# Patient Record
Sex: Female | Born: 1965 | Race: Black or African American | Hispanic: No | Smoking: Current every day smoker
Health system: Southern US, Community
[De-identification: ages and names within clinical notes are randomized; demographics above are authoritative.]

## PROBLEM LIST (undated history)

## (undated) DIAGNOSIS — K2970 Gastritis, unspecified, without bleeding: Secondary | ICD-10-CM

## (undated) DIAGNOSIS — K219 Gastro-esophageal reflux disease without esophagitis: Secondary | ICD-10-CM

## (undated) HISTORY — PX: ECTOPIC PREGNANCY SURGERY: SHX613

## (undated) HISTORY — PX: OTHER SURGICAL HISTORY: SHX169

---

## 2011-06-11 ENCOUNTER — Encounter (HOSPITAL_COMMUNITY): Payer: Self-pay

## 2011-06-11 ENCOUNTER — Emergency Department (HOSPITAL_COMMUNITY)
Admission: EM | Admit: 2011-06-11 | Discharge: 2011-06-11 | Disposition: A | Discharge status: Home / Self Care | Payer: Medicaid Other | Attending: Emergency Medicine | Admitting: Emergency Medicine

## 2011-06-11 DIAGNOSIS — F17200 Nicotine dependence, unspecified, uncomplicated: Secondary | ICD-10-CM | POA: Insufficient documentation

## 2011-06-11 DIAGNOSIS — R109 Unspecified abdominal pain: Secondary | ICD-10-CM | POA: Insufficient documentation

## 2011-06-11 DIAGNOSIS — A499 Bacterial infection, unspecified: Secondary | ICD-10-CM | POA: Insufficient documentation

## 2011-06-11 DIAGNOSIS — B9689 Other specified bacterial agents as the cause of diseases classified elsewhere: Secondary | ICD-10-CM | POA: Insufficient documentation

## 2011-06-11 DIAGNOSIS — N390 Urinary tract infection, site not specified: Secondary | ICD-10-CM

## 2011-06-11 DIAGNOSIS — N760 Acute vaginitis: Secondary | ICD-10-CM | POA: Insufficient documentation

## 2011-06-11 DIAGNOSIS — N949 Unspecified condition associated with female genital organs and menstrual cycle: Secondary | ICD-10-CM | POA: Insufficient documentation

## 2011-06-11 LAB — URINALYSIS, ROUTINE W REFLEX MICROSCOPIC
Specific Gravity, Urine: 1.019 (ref 1.005–1.030)
Urobilinogen, UA: 1 mg/dL (ref 0.0–1.0)

## 2011-06-11 LAB — PREGNANCY, URINE: Preg Test, Ur: NEGATIVE

## 2011-06-11 LAB — WET PREP, GENITAL

## 2011-06-11 LAB — URINE MICROSCOPIC-ADD ON

## 2011-06-11 MED ORDER — LIDOCAINE HCL (PF) 1 % IJ SOLN
INTRAMUSCULAR | Status: AC
  Administered 2011-06-11: 19:00:00
  Filled 2011-06-11: qty 30

## 2011-06-11 MED ORDER — SULFAMETHOXAZOLE-TRIMETHOPRIM 800-160 MG PO TABS: 1.0000 | ORAL_TABLET | Freq: Two times a day (BID) | ORAL | Status: AC

## 2011-06-11 MED ORDER — METRONIDAZOLE 500 MG PO TABS: 500.0000 mg | ORAL_TABLET | Freq: Two times a day (BID) | ORAL | Status: AC

## 2011-06-11 MED ORDER — CEFTRIAXONE SODIUM 1 G IJ SOLR
1.0000 g | Freq: Once | INTRAMUSCULAR | Status: AC
  Administered 2011-06-11: 1 g via INTRAMUSCULAR
  Filled 2011-06-11: qty 10

## 2011-06-11 NOTE — ED Notes (Signed)
Pt. Stopped her menstrual period 5 months ago and has developed vaginal pain and vaginal odor and discharge in the last few days

## 2011-06-11 NOTE — ED Provider Notes (Signed)
History     CSN: [id number]  Arrival date & time 06/11/11  1440   First MD Initiated Contact with Patient 06/11/11 1859      Chief Complaint  Patient presents with  . Vaginal Pain    (Consider location/radiation/quality/duration/timing/severity/associated sxs/prior treatment) HPI Comments: The patient presents with a 3 day history of lower abdominal pain and increased urinary frequency. This morning she also noticed a strong odor to her urine. She reports having "cloudy urine" as well. Her abdominal pain is located across her lower abdomen and does not radiate. She describes the pain as "crampy." She has a history of 6 ectopic pregnancies. She reports not had a period since July 2012 and has not had sexual intercourse since July 2012 also. She denies fever, NVD, abnormal vaginal discharge, change in bowels, hematuria, and blood in stool. She is concerned about having BV since she has had it once before.  The history is provided by the patient.    History reviewed. No pertinent past medical history.  Past Surgical History  Procedure Date  . Ectopics     pt. has had 6 ectopic pregnancies  . Ectopic pregnancy surgery     X 7 tubes were ruptured    No family history on file.  History  Substance Use Topics  . Smoking status: Current Everyday Smoker  . Smokeless tobacco: Not on file  . Alcohol Use: Yes    OB History    Grav Para Term Preterm Abortions TAB SAB Ect Mult Living                  Review of Systems  Constitutional: Negative for fever.  Gastrointestinal: Negative for vomiting, diarrhea and constipation.  Genitourinary: Negative for vaginal bleeding.  All other systems reviewed and are negative.    Allergies  Review of patient's allergies indicates no known allergies.  Home Medications  No current outpatient prescriptions on file.  BP 106/76  Pulse 98  Temp(Src) 97.1 F (36.2 C) (Oral)  Resp 16  Ht 5\' 5"  (1.651 m)  Wt 167 lb (75.751 kg)  BMI 27.79  kg/m2  SpO2 98%  LMP 12/09/2010  Physical Exam  Nursing note and vitals reviewed. Constitutional: She is oriented to person, place, and time. She appears well-developed and well-nourished.  HENT:  Head: Normocephalic and atraumatic.  Neck: Neck supple.  Cardiovascular: Normal rate, regular rhythm and normal heart sounds.   Pulmonary/Chest: Breath sounds normal. No respiratory distress. She has no wheezes. She has no rales. She exhibits no tenderness.  Abdominal: Soft. Bowel sounds are normal. She exhibits no distension and no mass. There is tenderness in the suprapubic area. There is no rebound and no guarding.    Neurological: She is alert and oriented to person, place, and time.  Psychiatric: She has a normal mood and affect. Her behavior is normal. Judgment and thought content normal.    ED Course  Procedures (including critical care time)  Labs Reviewed  URINALYSIS, ROUTINE W REFLEX MICROSCOPIC - Abnormal; Notable for the following:    APPearance CLOUDY (*)    Hgb urine dipstick MODERATE (*)    Nitrite POSITIVE (*)    Leukocytes, UA SMALL (*)    All other components within normal limits  URINE MICROSCOPIC-ADD ON - Abnormal; Notable for the following:    Squamous Epithelial / LPF FEW (*)    Bacteria, UA MANY (*)    All other components within normal limits  WET PREP, GENITAL - Abnormal; Notable for the  following:    Clue Cells Wet Prep HPF POC FEW (*)    WBC, Wet Prep HPF POC FEW (*)    All other components within normal limits  PREGNANCY, URINE  GC/CHLAMYDIA PROBE AMP, GENITAL   No results found.   1. Bacterial vaginosis   2. UTI (lower urinary tract infection)       MDM  Nontoxic, afebrile patient with urinary frequency and malodorous urine and diffuse lower abdominal pain. Clinically not consistent with appendicitis.  Pt treated for UTI, BV.  Patient has just moved from [place] and is working on getting her medicaid set up here, will have a PCP to follow up  with soon.  Pt discharged home with abx and resources for follow up.  Patient verbalizes understanding and agrees with plan.          Dillard Cannon [place], [place] 06/11/11 [phone]

## 2011-06-11 NOTE — Discharge Instructions (Signed)
Bacterial Vaginosis Bacterial vaginosis (BV) is a vaginal infection where the normal balance of bacteria in the vagina is disrupted. The normal balance is then replaced by an overgrowth of certain bacteria. There are several different kinds of bacteria that can cause BV. BV is the most common vaginal infection in women of childbearing age. CAUSES   The cause of BV is not fully understood. BV develops when there is an increase or imbalance of harmful bacteria.   Some activities or behaviors can upset the normal balance of bacteria in the vagina and put women at increased risk including:   Having a new sex partner or multiple sex partners.   Douching.   Using an intrauterine device (IUD) for contraception.   It is not clear what role sexual activity plays in the development of BV. However, women that have never had sexual intercourse are rarely infected with BV.  Women do not get BV from toilet seats, bedding, swimming pools or from touching objects around them.  SYMPTOMS   Grey vaginal discharge.   A fish-like odor with discharge, especially after sexual intercourse.   Itching or burning of the vagina and vulva.   Burning or pain with urination.   Some women have no signs or symptoms at all.  DIAGNOSIS  Your caregiver must examine the vagina for signs of BV. Your caregiver will perform lab tests and look at the sample of vaginal fluid through a microscope. They will look for bacteria and abnormal cells (clue cells), a pH test higher than 4.5, and a positive amine test all associated with BV.  RISKS AND COMPLICATIONS   Pelvic inflammatory disease (PID).   Infections following gynecology surgery.   Developing HIV.   Developing herpes virus.  TREATMENT  Sometimes BV will clear up without treatment. However, all women with symptoms of BV should be treated to avoid complications, especially if gynecology surgery is planned. Female partners generally do not need to be treated. However,  BV may spread between female sex partners so treatment is helpful in preventing a recurrence of BV.   BV may be treated with antibiotics. The antibiotics come in either pill or vaginal cream forms. Either can be used with nonpregnant or pregnant women, but the recommended dosages differ. These antibiotics are not harmful to the baby.   BV can recur after treatment. If this happens, a second round of antibiotics will often be prescribed.   Treatment is important for pregnant women. If not treated, BV can cause a premature delivery, especially for a pregnant woman who had a premature birth in the past. All pregnant women who have symptoms of BV should be checked and treated.   For chronic reoccurrence of BV, treatment with a type of prescribed gel vaginally twice a week is helpful.  HOME CARE INSTRUCTIONS   Finish all medication as directed by your caregiver.   Do not have sex until treatment is completed.   Tell your sexual partner that you have a vaginal infection. They should see their caregiver and be treated if they have problems, such as a mild rash or itching.   Practice safe sex. Use condoms. Only have 1 sex partner.  PREVENTION  Basic prevention steps can help reduce the risk of upsetting the natural balance of bacteria in the vagina and developing BV:  Do not have sexual intercourse (be abstinent).   Do not douche.   Use all of the medicine prescribed for treatment of BV, even if the signs and symptoms go away.     Tell your sex partner if you have BV. That way, they can be treated, if needed, to prevent reoccurrence.  SEEK MEDICAL CARE IF:   Your symptoms are not improving after 3 days of treatment.   You have increased discharge, pain, or fever.  MAKE SURE YOU:   Understand these instructions.   Will watch your condition.   Will get help right away if you are not doing well or get worse.  FOR MORE INFORMATION  Division of STD Prevention (DSTDP), Centers for Disease  Control and Prevention: SolutionApps.co.za American Social Health Association (ASHA): www.ashastd.org  Document Released: 03/30/2005 Document Revised: 12/10/2010 Document Reviewed: 09/20/2008 [facility] Patient Information 2012 [place], [place].Urinary Tract Infection Infections of the urinary tract can start in several places. A bladder infection (cystitis), a kidney infection (pyelonephritis), and a prostate infection (prostatitis) are different types of urinary tract infections (UTIs). They usually get better if treated with medicines (antibiotics) that kill germs. Take all the medicine until it is gone. You or your child may feel better in a few days, but TAKE ALL MEDICINE or the infection may not respond and may become more difficult to treat. HOME CARE INSTRUCTIONS   Drink enough water and fluids to keep the urine clear or pale yellow. Cranberry juice is especially recommended, in addition to large amounts of water.   Avoid caffeine, tea, and carbonated beverages. They tend to irritate the bladder.   Alcohol may irritate the prostate.   Only take over-the-counter or prescription medicines for pain, discomfort, or fever as directed by your caregiver.  To prevent further infections:  Empty the bladder often. Avoid holding urine for long periods of time.   After a bowel movement, women should cleanse from front to back. Use each tissue only once.   Empty the bladder before and after sexual intercourse.  FINDING OUT THE RESULTS OF YOUR TEST Not all test results are available during your visit. If your or your child's test results are not back during the visit, make an appointment with your caregiver to find out the results. Do not assume everything is normal if you have not heard from your caregiver or the medical facility. It is important for you to follow up on all test results. SEEK MEDICAL CARE IF:   There is back pain.   Your baby is older than 3 months with a rectal temperature of 100.5  F (38.1 C) or higher for more than 1 day.   Your or your child's problems (symptoms) are no better in 3 days. Return sooner if you or your child is getting worse.  SEEK IMMEDIATE MEDICAL CARE IF:   There is severe back pain or lower abdominal pain.   You or your child develops chills.   You have a fever.   Your baby is older than 3 months with a rectal temperature of 102 F (38.9 C) or higher.   Your baby is 42 months old or younger with a rectal temperature of 100.4 F (38 C) or higher.   There is nausea or vomiting.   There is continued burning or discomfort with urination.  MAKE SURE YOU:   Understand these instructions.   Will watch your condition.   Will get help right away if you are not doing well or get worse.  Document Released: 01/07/2005 Document Revised: 12/10/2010 Document Reviewed: 08/12/2006 [facility] Patient Information 2012 [place], [place].  RESOURCE GUIDE  Dental Problems  Patients with Medicaid: [facility]  [place] Phone:  [phone]                                                  Phone:  [phone]  If unable to pay or uninsured, contact:  [facility] or [facility]. to become qualified for the adult dental clinic.  Chronic Pain Problems Contact [facility]  [phone] Patients need to be referred by their primary care doctor.  Insufficient Money for Medicine Contact [employer]:  call "211" or [place] [phone].  No Primary Care Doctor Call [facility]  [phone] Other agencies that provide inexpensive medical care    [place]  [phone]    [facility]  [place]  [phone]    [facility]  [phone]    [facility]  [place]   [place]  [phone] [place]   [phone] (emergency services [phone])  Substance Abuse Resources [facility]  [phone] [facility] [phone] [place] [phone] [place] [place] [phone] Residential & Outpatient Substance Abuse Program  [phone]  Abuse/Neglect [place] [phone] [place] [phone] (After Hours)  Emergency [place] [phone]  [place] [facility] the [place] [phone] [place] [phone]  MRSA Hotline #:   475-502-4824    [facility] Dept. [address]      [place] Phone:  [phone]                                   Phone:  [phone]                 Phone:  [place] Phone:  [place] [phone] (After Hours)

## 2011-06-12 LAB — GC/CHLAMYDIA PROBE AMP, GENITAL
Chlamydia, DNA Probe: NEGATIVE
GC Probe Amp, Genital: NEGATIVE

## 2011-06-12 NOTE — ED Provider Notes (Signed)
Medical screening examination/treatment/procedure(s) were performed by non-physician practitioner and as supervising physician I was immediately available for consultation/collaboration.   Glynn Octave, MD 06/12/11 1037

## 2011-12-22 ENCOUNTER — Encounter (HOSPITAL_COMMUNITY): Payer: Self-pay | Admitting: *Deleted

## 2011-12-22 ENCOUNTER — Emergency Department (INDEPENDENT_AMBULATORY_CARE_PROVIDER_SITE_OTHER)
Admission: EM | Admit: 2011-12-22 | Discharge: 2011-12-22 | Disposition: A | Discharge status: Home / Self Care | Payer: Medicaid Other | Source: Home / Self Care | Attending: Family Medicine | Admitting: Family Medicine

## 2011-12-22 DIAGNOSIS — M67919 Unspecified disorder of synovium and tendon, unspecified shoulder: Secondary | ICD-10-CM

## 2011-12-22 DIAGNOSIS — M719 Bursopathy, unspecified: Secondary | ICD-10-CM

## 2011-12-22 MED ORDER — DICLOFENAC POTASSIUM 50 MG PO TABS: 50.0000 mg | ORAL_TABLET | Freq: Three times a day (TID) | ORAL | Status: DC

## 2011-12-22 NOTE — ED Provider Notes (Signed)
History     CSN: [id number]  Arrival date & time 12/22/11  1148   First MD Initiated Contact with Patient 12/22/11 1320      Chief Complaint  Patient presents with  . Shoulder Pain    (Consider location/radiation/quality/duration/timing/severity/associated sxs/prior treatment) Patient is a 46 y.o. female presenting with shoulder pain. The history is provided by the patient.  Shoulder Pain This is a new problem. The current episode started more than 2 days ago. The problem has been gradually worsening. She has tried nothing for the symptoms.    History reviewed. No pertinent past medical history.  Past Surgical History  Procedure Date  . Ectopics     pt. has had 6 ectopic pregnancies  . Ectopic pregnancy surgery     X 7 tubes were ruptured    No family history on file.  History  Substance Use Topics  . Smoking status: Current Every Day Smoker  . Smokeless tobacco: Not on file  . Alcohol Use: Yes    OB History    Grav Para Term Preterm Abortions TAB SAB Ect Mult Living                  Review of Systems  Constitutional: Negative.   Musculoskeletal: Positive for joint swelling.    Allergies  Review of patient's allergies indicates no known allergies.  Home Medications   Current Outpatient Rx  Name Route Sig Dispense Refill  . DICLOFENAC POTASSIUM 50 MG PO TABS Oral Take 1 tablet (50 mg total) by mouth 3 (three) times daily. 30 tablet 0    BP 113/69  Pulse 69  Temp 97.8 F (36.6 C) (Oral)  Resp 20  SpO2 97%  Physical Exam  Nursing note and vitals reviewed. Constitutional: She is oriented to person, place, and time. She appears well-developed and well-nourished.  Musculoskeletal: She exhibits tenderness.       Left shoulder: She exhibits decreased range of motion, tenderness and bony tenderness. She exhibits no deformity and normal pulse.       Arms: Neurological: She is alert and oriented to person, place, and time. No cranial nerve deficit.    Skin: Skin is warm and dry.    ED Course  Procedures (including critical care time)  Labs Reviewed - No data to display No results found.   1. Tendonitis of shoulder, left       MDM          Linna Hoff, MD 12/26/11 1057

## 2011-12-22 NOTE — ED Notes (Signed)
Pt reports        Pain l  Shoulder  X  3  Days     As  Well  As  Pain  r hand   With  Some    Numbness  Fingers  Of  The  Affected  Hand         She  denys  Any  specefic  Injury

## 2012-02-07 ENCOUNTER — Encounter (HOSPITAL_COMMUNITY): Payer: Self-pay

## 2012-02-07 ENCOUNTER — Emergency Department (HOSPITAL_COMMUNITY)
Admission: EM | Admit: 2012-02-07 | Discharge: 2012-02-07 | Disposition: A | Discharge status: Home / Self Care | Payer: Medicaid Other | Attending: Emergency Medicine | Admitting: Emergency Medicine

## 2012-02-07 ENCOUNTER — Emergency Department (HOSPITAL_COMMUNITY): Payer: Medicaid Other

## 2012-02-07 DIAGNOSIS — S63509A Unspecified sprain of unspecified wrist, initial encounter: Secondary | ICD-10-CM | POA: Insufficient documentation

## 2012-02-07 DIAGNOSIS — Y929 Unspecified place or not applicable: Secondary | ICD-10-CM | POA: Insufficient documentation

## 2012-02-07 DIAGNOSIS — F17200 Nicotine dependence, unspecified, uncomplicated: Secondary | ICD-10-CM | POA: Insufficient documentation

## 2012-02-07 DIAGNOSIS — Y9389 Activity, other specified: Secondary | ICD-10-CM | POA: Insufficient documentation

## 2012-02-07 DIAGNOSIS — W19XXXA Unspecified fall, initial encounter: Secondary | ICD-10-CM | POA: Insufficient documentation

## 2012-02-07 DIAGNOSIS — M549 Dorsalgia, unspecified: Secondary | ICD-10-CM | POA: Insufficient documentation

## 2012-02-07 MED ORDER — DIPHENHYDRAMINE HCL 25 MG PO CAPS
25.0000 mg | ORAL_CAPSULE | Freq: Once | ORAL | Status: AC
  Administered 2012-02-07: 25 mg via ORAL
  Filled 2012-02-07: qty 1

## 2012-02-07 MED ORDER — HYDROCODONE-ACETAMINOPHEN 5-325 MG PO TABS
2.0000 | ORAL_TABLET | Freq: Once | ORAL | Status: AC
  Administered 2012-02-07: 2 via ORAL
  Filled 2012-02-07: qty 2

## 2012-02-07 MED ORDER — IBUPROFEN 600 MG PO TABS: 600.0000 mg | ORAL_TABLET | Freq: Four times a day (QID) | ORAL | Status: DC | PRN

## 2012-02-07 NOTE — ED Notes (Signed)
[id number]<AV> Expected date:02/07/12<BR> Expected time: 1:55 PM<BR> Means of arrival:Ambulance<BR> Comments:<BR> Fall/ ETOH/back pain

## 2012-02-07 NOTE — ED Provider Notes (Signed)
Medical screening examination/treatment/procedure(s) were performed by non-physician practitioner and as supervising physician I was immediately available for consultation/collaboration.  Toy Baker, MD 02/07/12 2241

## 2012-02-07 NOTE — ED Notes (Signed)
Ortho tech aware 

## 2012-02-07 NOTE — ED Notes (Signed)
Patient transported to X-ray 

## 2012-02-07 NOTE — ED Notes (Addendum)
Per EMS-Patient reports that she was drinking a lot last night and smoking marijuana. Patient's family reports that they had to put patient in the bed when she passed out, but rolled out during the night. Patient woke this AM with bil hand pain with pins and needle pain and pain of the lower back. MAE.

## 2012-02-07 NOTE — ED Provider Notes (Signed)
History     CSN: [id number]  Arrival date & time 02/07/12  1400   First MD Initiated Contact with Patient 02/07/12 1521      Chief Complaint  Patient presents with  . Back Pain  . Hand Pain    (Consider location/radiation/quality/duration/timing/severity/associated sxs/prior treatment) HPI Comments: This is a 46 year old female, who presents to the emergency department with chief complaint of hand, wrist, and back pain. Patient states that her partying last night, when the patient became intoxicated and passed out and collapsed on her hands and wrists. The patient's family member states that they put the patient in her bed, and allowed her to sleep. This morning the patient awoke with sharp pains in her hands and mild tenderness of the thoracic and lumbar spine. The patient's pain is moderate to severe. The pain does not radiate. Has not tried anything to alleviate the symptoms.  Patient is a 46 y.o. female presenting with hand pain. The history is provided by the patient. No language interpreter was used.  Hand Pain    History reviewed. No pertinent past medical history.  Past Surgical History  Procedure Date  . Ectopics     pt. has had 6 ectopic pregnancies  . Ectopic pregnancy surgery     X 7 tubes were ruptured    History reviewed. No pertinent family history.  History  Substance Use Topics  . Smoking status: Current Every Day Smoker  . Smokeless tobacco: Never Used  . Alcohol Use: Yes     weekends    OB History    Grav Para Term Preterm Abortions TAB SAB Ect Mult Living                  Review of Systems  Musculoskeletal: Positive for back pain.       Bilateral hand and wrist pain, and thoracic and lumbar back pain.  All other systems reviewed and are negative.    Allergies  Review of patient's allergies indicates no known allergies.  Home Medications  No current outpatient prescriptions on file.  BP 129/80  Pulse 76  Temp 97.9 F (36.6 C) (Oral)   Resp 18  SpO2 100%  Physical Exam  Nursing note and vitals reviewed. Constitutional: She is oriented to person, place, and time. She appears well-developed and well-nourished.  HENT:  Head: Normocephalic and atraumatic.  Eyes: Conjunctivae normal and EOM are normal. Pupils are equal, round, and reactive to light.  Neck: Normal range of motion. Neck supple.  Cardiovascular: Normal rate, regular rhythm and normal heart sounds.   Pulmonary/Chest: Breath sounds normal.  Abdominal: Soft. Bowel sounds are normal.  Musculoskeletal: She exhibits edema and tenderness.       Tenderness to palpation of the bilateral wrists and hands there is tenderness is diffuse. Strength is limited bilaterally secondary to pain, sensation is intact bilaterally. Capillary refill is brisk bilaterally. Range of motion also limited secondary to pain. Thoracic and lumbar regions tender to palpation.  Neurological: She is alert and oriented to person, place, and time.  Skin: Skin is warm and dry.  Psychiatric: She has a normal mood and affect. Her behavior is normal. Judgment and thought content normal.    ED Course  Procedures (including critical care time)   Results for orders placed during the hospital encounter of 06/11/11  URINALYSIS, ROUTINE W REFLEX MICROSCOPIC      Component Value Range   Color, Urine YELLOW  YELLOW   APPearance CLOUDY (*) CLEAR   Specific Gravity,  Urine 1.019  1.005 - 1.030   pH 6.0  5.0 - 8.0   Glucose, UA NEGATIVE  NEGATIVE mg/dL   Hgb urine dipstick MODERATE (*) NEGATIVE   Bilirubin Urine NEGATIVE  NEGATIVE   Ketones, ur NEGATIVE  NEGATIVE mg/dL   Protein, ur NEGATIVE  NEGATIVE mg/dL   Urobilinogen, UA 1.0  0.0 - 1.0 mg/dL   Nitrite POSITIVE (*) NEGATIVE   Leukocytes, UA SMALL (*) NEGATIVE  PREGNANCY, URINE      Component Value Range   Preg Test, Ur NEGATIVE  NEGATIVE  URINE MICROSCOPIC-ADD ON      Component Value Range   Squamous Epithelial / LPF FEW (*) RARE   WBC, UA  3-6  <3 WBC/hpf   RBC / HPF 0-2  <3 RBC/hpf   Bacteria, UA MANY (*) RARE  WET PREP, GENITAL      Component Value Range   Yeast Wet Prep HPF POC NONE SEEN  NONE SEEN   Trich, Wet Prep NONE SEEN  NONE SEEN   Clue Cells Wet Prep HPF POC FEW (*) NONE SEEN   WBC, Wet Prep HPF POC FEW (*) NONE SEEN  GC/CHLAMYDIA PROBE AMP, GENITAL      Component Value Range   GC Probe Amp, Genital NEGATIVE  NEGATIVE   Chlamydia, DNA Probe NEGATIVE  NEGATIVE   Dg Lumbar Spine Complete  02/07/2012  *RADIOLOGY REPORT*  Clinical Data: Low back pain after falling from bed.  LUMBAR SPINE - COMPLETE 4+ VIEW  Comparison: None.  Findings: There are five lumbar type vertebral bodies.  The alignment is normal.  The disc spaces are preserved.  There is mild intervertebral spurring at L2-L3 and L3-L4.  There is no evidence of acute fracture or pars defect.  There is probable mild facet hypertrophy inferiorly.  IMPRESSION: No acute osseous findings or malalignment.  Mild spondylosis.   Original Report Authenticated By: Gerrianne Scale, M.D.    Dg Wrist Complete Left  02/07/2012  *RADIOLOGY REPORT*  Clinical Data: Fall from bed.  Wrist pain.  LEFT WRIST - COMPLETE 3+ VIEW  Comparison: None.  Findings: The mineralization and alignment are normal.  There is no evidence of acute fracture or dislocation.  The joint spaces are maintained.  No focal soft tissue abnormalities are identified.  IMPRESSION: No acute osseous findings.   Original Report Authenticated By: Gerrianne Scale, M.D.    Dg Wrist Complete Right  02/07/2012  *RADIOLOGY REPORT*  Clinical Data: Fall from bed.  Wrist pain.  RIGHT WRIST - COMPLETE 3+ VIEW  Comparison: None.  Findings: The mineralization and alignment are normal.  There is no evidence of acute fracture or dislocation.  The joint spaces are preserved.  No focal soft tissue swelling is evident.  IMPRESSION: No acute osseous findings.   Original Report Authenticated By: Gerrianne Scale, M.D.    Dg  Hand Complete Left  02/07/2012  *RADIOLOGY REPORT*  Clinical Data: Fall from bed.  Hand pain.  LEFT HAND - COMPLETE 3+ VIEW  Comparison: None.  Findings: The mineralization and alignment are normal.  There is no evidence of acute fracture or dislocation.  The joint spaces are maintained.  No focal soft tissue abnormalities are identified.  IMPRESSION: No acute osseous findings.   Original Report Authenticated By: Gerrianne Scale, M.D.    Dg Hand Complete Right  02/07/2012  *RADIOLOGY REPORT*  Clinical Data: Fall from bed.  Hand pain.  RIGHT HAND - COMPLETE 3+ VIEW  Comparison: None.  Findings:  The mineralization and alignment are normal.  There is no evidence of acute fracture or dislocation.  The joint spaces are preserved.  No focal soft tissue abnormalities are identified.  IMPRESSION: No acute osseous findings.   Original Report Authenticated By: Gerrianne Scale, M.D.        1. Sprain of wrist       MDM  46 year old female, with bilateral wrist sprains. Labs are detailed as above. Patient fell to the ground while drunk last night. In going to discharge the patient with bilateral wrist splints, ibuprofen, and instructions to followup with orthopedics later this week. The patient was given a Vicodin in the emergency department as well as Benadryl for itching. The patient is stable and ready for discharge.        Roxy Horseman, PA-C 02/07/12 1651

## 2012-10-12 ENCOUNTER — Other Ambulatory Visit: Payer: Self-pay | Admitting: Family Medicine

## 2012-10-12 DIAGNOSIS — Z1231 Encounter for screening mammogram for malignant neoplasm of breast: Secondary | ICD-10-CM

## 2012-10-20 ENCOUNTER — Emergency Department (INDEPENDENT_AMBULATORY_CARE_PROVIDER_SITE_OTHER)
Admission: EM | Admit: 2012-10-20 | Discharge: 2012-10-20 | Disposition: A | Discharge status: Home / Self Care | Payer: Medicaid Other | Source: Home / Self Care | Attending: Family Medicine | Admitting: Family Medicine

## 2012-10-20 ENCOUNTER — Encounter (HOSPITAL_COMMUNITY): Payer: Self-pay | Admitting: *Deleted

## 2012-10-20 DIAGNOSIS — K089 Disorder of teeth and supporting structures, unspecified: Secondary | ICD-10-CM

## 2012-10-20 DIAGNOSIS — H6092 Unspecified otitis externa, left ear: Secondary | ICD-10-CM

## 2012-10-20 DIAGNOSIS — H60399 Other infective otitis externa, unspecified ear: Secondary | ICD-10-CM

## 2012-10-20 DIAGNOSIS — K0889 Other specified disorders of teeth and supporting structures: Secondary | ICD-10-CM

## 2012-10-20 MED ORDER — TRAMADOL HCL 50 MG PO TABS: 50.0000 mg | ORAL_TABLET | Freq: Three times a day (TID) | ORAL | Status: DC | PRN

## 2012-10-20 MED ORDER — NEOMYCIN-POLYMYXIN-HC 3.5-10000-1 OT SUSP: 4.0000 [drp] | Freq: Three times a day (TID) | OTIC | Status: DC

## 2012-10-20 MED ORDER — IBUPROFEN 800 MG PO TABS
ORAL_TABLET | ORAL | Status: AC
  Filled 2012-10-20: qty 1

## 2012-10-20 MED ORDER — IBUPROFEN 800 MG PO TABS
800.0000 mg | ORAL_TABLET | Freq: Once | ORAL | Status: AC
  Administered 2012-10-20: 800 mg via ORAL

## 2012-10-20 MED ORDER — IBUPROFEN 600 MG PO TABS: 600.0000 mg | ORAL_TABLET | Freq: Three times a day (TID) | ORAL | Status: DC | PRN

## 2012-10-20 MED ORDER — CLINDAMYCIN HCL 300 MG PO CAPS: 300.0000 mg | ORAL_CAPSULE | Freq: Four times a day (QID) | ORAL | Status: DC

## 2012-10-20 MED ORDER — HYDROCODONE-ACETAMINOPHEN 5-325 MG PO TABS
ORAL_TABLET | ORAL | Status: AC
  Filled 2012-10-20: qty 1

## 2012-10-20 MED ORDER — HYDROCODONE-ACETAMINOPHEN 5-325 MG PO TABS
1.0000 | ORAL_TABLET | Freq: Once | ORAL | Status: AC
  Administered 2012-10-20: 1 via ORAL

## 2012-10-20 NOTE — ED Notes (Signed)
Pt  Reports  l  Sided  Toothache  X  3  Days        Symptoms  unreleived  By  [facility]  meds

## 2012-10-20 NOTE — ED Provider Notes (Signed)
History    CSN: [id number] Arrival date & time 10/20/12  0911  First MD Initiated Contact with Patient 10/20/12 0935     Chief Complaint  Patient presents with  . Dental Pain   (Consider location/radiation/quality/duration/timing/severity/associated sxs/prior Treatment) HPI Comments: 47 year old nondiabetic smoker female. Here complaining of left upper jaw pain with radiation to the left ear for 3 days. Has taken over-the-counter Tylenol 500 mg today with no relief. States she cannot sleep at night due to pain. Denies spontaneous drainage. No fever or chills. Has had some nasal congestion and rhinorrhea. No sore throat. No chest pain or shortest of breath. No abdominal pain nausea vomiting or diarrhea.  History reviewed. No pertinent past medical history. Past Surgical History  Procedure Laterality Date  . Ectopics      pt. has had 6 ectopic pregnancies  . Ectopic pregnancy surgery      X 7 tubes were ruptured   History reviewed. No pertinent family history. History  Substance Use Topics  . Smoking status: Current Every Day Smoker  . Smokeless tobacco: Never Used  . Alcohol Use: Yes     Comment: weekends   OB History   Grav Para Term Preterm Abortions TAB SAB Ect Mult Living                 Review of Systems  Constitutional: Negative for fever and chills.  HENT: Positive for ear pain, congestion and sneezing. Negative for hearing loss, sore throat, facial swelling, trouble swallowing, neck pain and ear discharge.   Respiratory: Negative for cough, shortness of breath and wheezing.   Cardiovascular: Negative for chest pain.  Gastrointestinal: Negative for nausea, vomiting and abdominal pain.  Skin: Negative for rash.  Neurological: Negative for dizziness and headaches.    Allergies  Review of patient's allergies indicates no known allergies.  Home Medications   Current Outpatient Rx  Name  Route  Sig  Dispense  Refill  . Acetaminophen (TYLENOL PO)   Oral   Take  by mouth.         . clindamycin (CLEOCIN) 300 MG capsule   Oral   Take 1 capsule (300 mg total) by mouth 4 (four) times daily.   28 capsule   0   . ibuprofen (ADVIL,MOTRIN) 600 MG tablet   Oral   Take 1 tablet (600 mg total) by mouth every 6 (six) hours as needed for pain.   30 tablet   0   . ibuprofen (ADVIL,MOTRIN) 600 MG tablet   Oral   Take 1 tablet (600 mg total) by mouth every 8 (eight) hours as needed for pain. Take with food   30 tablet   0   . neomycin-polymyxin-hydrocortisone (CORTISPORIN) 3.5-10000-1 otic suspension   Left Ear   Place 4 drops into the left ear 3 (three) times daily.   10 mL   0   . traMADol (ULTRAM) 50 MG tablet   Oral   Take 1 tablet (50 mg total) by mouth every 8 (eight) hours as needed for pain.   20 tablet   0    BP 136/70  Pulse 59  Temp(Src) 97.7 F (36.5 C) (Oral)  Resp 16  SpO2 100% Physical Exam  Nursing note and vitals reviewed. Constitutional: She is oriented to person, place, and time. She appears well-developed and well-nourished. No distress.  Tearful due to pain.  HENT:  Head: Normocephalic and atraumatic.  Nasal Congestion with erythema and swelling of nasal turbinates, clear rhinorrhea. no pharyngeal erythema  no exudates. No uvula deviation. No trismus. Right TM: Clear fluid behind. No swelling redness or bulging. Left ear canal occluded by earwax plug. After ear wax block removal by urination there is noted erythema and mild swelling of the ear canal. No obvious exudates. There is a tender partially emerged posterior molar and her left upper jaw that is tender to palpation but no fluctuations or erythema of the mucosa around. There is a fractured posterior molar in the left lower jaw with no gum swelling or fluctuations. No facial swelling.  Eyes: Conjunctivae and EOM are normal. Pupils are equal, round, and reactive to light.  Neck: Neck supple. No JVD present. No thyromegaly present.  Cardiovascular: Normal heart  sounds.   Pulmonary/Chest: Breath sounds normal.  Abdominal: Soft. There is no tenderness.  Lymphadenopathy:    She has no cervical adenopathy.  Neurological: She is alert and oriented to person, place, and time.  Skin: No rash noted. She is not diaphoretic.    ED Course  Procedures (including critical care time) Labs Reviewed - No data to display No results found. 1. Pain, dental   2. External otitis of left ear     MDM  Treated with clindamycin, tramadol, ibuprofen and Cortisporin ear drops. Supportive care and red flags that should prompt her return to medical attention discussed with patient and provided in writing.  Sharin Grave, MD 10/21/12 [phone]

## 2012-11-11 ENCOUNTER — Encounter (HOSPITAL_COMMUNITY): Payer: Self-pay | Admitting: Family Medicine

## 2012-11-11 ENCOUNTER — Emergency Department (HOSPITAL_COMMUNITY): Payer: Medicaid Other

## 2012-11-11 ENCOUNTER — Emergency Department (HOSPITAL_COMMUNITY)
Admission: EM | Admit: 2012-11-11 | Discharge: 2012-11-11 | Disposition: A | Discharge status: Home / Self Care | Payer: Medicaid Other | Attending: Emergency Medicine | Admitting: Emergency Medicine

## 2012-11-11 DIAGNOSIS — R110 Nausea: Secondary | ICD-10-CM | POA: Insufficient documentation

## 2012-11-11 DIAGNOSIS — F17200 Nicotine dependence, unspecified, uncomplicated: Secondary | ICD-10-CM | POA: Insufficient documentation

## 2012-11-11 DIAGNOSIS — K2970 Gastritis, unspecified, without bleeding: Secondary | ICD-10-CM

## 2012-11-11 DIAGNOSIS — K2990 Gastroduodenitis, unspecified, without bleeding: Secondary | ICD-10-CM | POA: Insufficient documentation

## 2012-11-11 LAB — BASIC METABOLIC PANEL
CO2: 29 mEq/L (ref 19–32)
Chloride: 101 mEq/L (ref 96–112)
GFR calc non Af Amer: >90 mL/min (ref >90)
Glucose, Bld: 98 mg/dL (ref 70–99)
Potassium: 3.6 mEq/L (ref 3.5–5.1)
Sodium: 136 mEq/L (ref 135–145)

## 2012-11-11 LAB — POCT I-STAT TROPONIN I: Troponin i, poc: 0 ng/mL (ref 0.00–0.08)

## 2012-11-11 LAB — CBC
HCT: 35.2 % — ABNORMAL LOW (ref 36.0–46.0)
Hemoglobin: 11.7 g/dL — ABNORMAL LOW (ref 12.0–15.0)
RBC: 4.36 MIL/uL (ref 3.87–5.11)
WBC: 7.8 10*3/uL (ref 4.0–10.5)

## 2012-11-11 MED ORDER — OMEPRAZOLE 40 MG PO CPDR: 40.0000 mg | DELAYED_RELEASE_CAPSULE | Freq: Every day | ORAL | Status: DC

## 2012-11-11 MED ORDER — GI COCKTAIL ~~LOC~~
30.0000 mL | Freq: Once | ORAL | Status: AC
  Administered 2012-11-11: 30 mL via ORAL
  Filled 2012-11-11: qty 30

## 2012-11-11 MED ORDER — NITROGLYCERIN 0.4 MG SL SUBL: 0.4000 mg | SUBLINGUAL_TABLET | SUBLINGUAL | Status: DC | PRN

## 2012-11-11 NOTE — ED Notes (Signed)
Pt comfortable with d/c and f/u instructions. Prescriptions x1 

## 2012-11-11 NOTE — ED Notes (Signed)
Per EMS pt has been having constant CP x2 days that is in the epigastric and left breast area. Pt denies NVD or SOB.  Pt given 2SL nitro and 324mg  ASA PTA with some relief (Pain from 10/10 to 7/10).

## 2012-11-11 NOTE — ED Provider Notes (Signed)
CSN: [id number]     Arrival date & time 11/11/12  2104 History     First MD Initiated Contact with Patient 11/11/12 2115     Chief Complaint  Patient presents with  . Chest Pain   (Consider location/radiation/quality/duration/timing/severity/associated sxs/prior Treatment) Patient is a 47 y.o. female presenting with abdominal pain. The history is provided by the patient.  Abdominal Pain This is a new problem. The current episode started yesterday. The problem occurs constantly. The problem has been gradually worsening. Associated symptoms include abdominal pain and nausea. Pertinent negatives include no chest pain, chills, congestion, coughing, diaphoresis, fatigue, fever, headaches, neck pain, numbness, rash, sore throat or vomiting. The symptoms are aggravated by eating (nsaids). She has tried NSAIDs (pepto-bismal, nitroglycerin) for the symptoms. The treatment provided no relief.    History reviewed. No pertinent past medical history. Past Surgical History  Procedure Laterality Date  . Ectopics      pt. has had 6 ectopic pregnancies  . Ectopic pregnancy surgery      X 7 tubes were ruptured   History reviewed. No pertinent family history. History  Substance Use Topics  . Smoking status: Current Every Day Smoker  . Smokeless tobacco: Never Used  . Alcohol Use: Yes     Comment: weekends   OB History   Grav Para Term Preterm Abortions TAB SAB Ect Mult Living                 Review of Systems  Constitutional: Negative for fever, chills, diaphoresis and fatigue.  HENT: Negative for ear pain, congestion, sore throat, facial swelling, mouth sores, trouble swallowing, neck pain and neck stiffness.   Eyes: Negative.   Respiratory: Negative for apnea, cough, chest tightness, shortness of breath and wheezing.   Cardiovascular: Negative for chest pain, palpitations and leg swelling.  Gastrointestinal: Positive for nausea and abdominal pain. Negative for vomiting, diarrhea and  abdominal distention.  Genitourinary: Negative for hematuria, flank pain, vaginal discharge, difficulty urinating and menstrual problem.  Musculoskeletal: Negative for back pain and gait problem.  Skin: Negative for rash and wound.  Neurological: Negative for dizziness, tremors, seizures, syncope, facial asymmetry, numbness and headaches.  Psychiatric/Behavioral: Negative.   All other systems reviewed and are negative.    Allergies  Review of patient's allergies indicates no known allergies.  Home Medications   Current Outpatient Rx  Name  Route  Sig  Dispense  Refill  . bismuth subsalicylate (PEPTO BISMOL) 262 MG chewable tablet   Oral   Chew 524 mg by mouth as needed for indigestion. For acid reflux/heartburn         . omeprazole (PRILOSEC) 40 MG capsule   Oral   Take 1 capsule (40 mg total) by mouth daily.   30 capsule   2    BP 119/68  Pulse 64  Temp(Src) 97.7 F (36.5 C) (Oral)  Resp 15  SpO2 100% Physical Exam  Nursing note and vitals reviewed. Constitutional: She is oriented to person, place, and time. She appears well-developed and well-nourished. No distress.  HENT:  Head: Normocephalic and atraumatic.  Right Ear: External ear normal.  Left Ear: External ear normal.  Nose: Nose normal.  Mouth/Throat: Oropharynx is clear and moist. No oropharyngeal exudate.  Eyes: Conjunctivae and EOM are normal. Pupils are equal, round, and reactive to light. Right eye exhibits no discharge. Left eye exhibits no discharge.  Neck: Normal range of motion. Neck supple. No JVD present. No tracheal deviation present. No thyromegaly present.  Cardiovascular: Normal  rate, regular rhythm, normal heart sounds and intact distal pulses.  Exam reveals no gallop and no friction rub.   No murmur heard. Pulmonary/Chest: Effort normal and breath sounds normal. No respiratory distress. She has no wheezes. She has no rales. She exhibits no tenderness.  Abdominal: Soft. Bowel sounds are  normal. She exhibits no distension. There is tenderness (pain in the LUQ on exam. patient says this is where all of her pain is. ). There is no rebound and no guarding.  Musculoskeletal: Normal range of motion.  Lymphadenopathy:    She has no cervical adenopathy.  Neurological: She is alert and oriented to person, place, and time. No cranial nerve deficit. Coordination normal.  Skin: Skin is warm. No rash noted. She is not diaphoretic.  Psychiatric: She has a normal mood and affect. Her behavior is normal. Judgment and thought content normal.    ED Course   Procedures (including critical care time)  Labs Reviewed  CBC - Abnormal; Notable for the following:    Hemoglobin 11.7 (*)    HCT 35.2 (*)    All other components within normal limits  BASIC METABOLIC PANEL  LIPASE, BLOOD  POCT I-STAT TROPONIN I   Dg Chest Port 1 View  11/11/2012   *RADIOLOGY REPORT*  Clinical Data: Chest pain  PORTABLE CHEST - 1 VIEW  Comparison: None.  Findings: The heart and pulmonary vascularity are within normal limits.  The lungs are clear bilaterally.  No pneumothorax or effusion is seen.  No bony abnormality is noted.  IMPRESSION: No acute abnormalities seen.   Original Report Authenticated By: Alcide Clever, M.D.   1. Gastritis     MDM  47 yr old female patient here with LUQ pain that radiates to her chest and throat. Says it feels "like stomach cramping and reflux." Patient recently treated for tooth infection with ibuprofen and tramadol for 2 weeks prior to this starting yesterday. Has post prandial pain. No vomiting, or diarrhea. No lower abdominal pain, vaginal bleeding or discharge. No Murphy's sign, no hx of pancreatitis, EtOH abuse, gall stones. Will screen for atypical ACS with EKG, but patient low risk as she only has one risk factor: smoking. Well's score is 0, PERC negative. Will give GI cocktail and obtain labs to screen for other occult process. Will encourage patient to refrain from NSAID's as her  dental issue is resolved and will prescribe a PPI.  Normal EKG, normal labs and normal CXR.  Pain completely resolved with GI cocktail. Will prescribe PPI and tell not to use ibuprofen or other NSAID's.  Case discussed with Dr. Ermalene Postin, MD 11/11/12 [phone]

## 2012-11-12 NOTE — ED Provider Notes (Signed)
I saw and evaluated the patient, reviewed the resident's note and I agree with the findings and plan. The patient presents with pain in the left upper quadrant since yesterday.  She was recently started on antibiotics, pain meds for a tooth infection and has also been taking ibuprofen prior to the onset of her abdominal pain.  She denies fevers or chills.  No diarrhea.  She denies to me she is having any chest pain or shortness of breath.    On exam, the patient is afebrile and the vitals are stable.  The heart is regular rate and rhythm without murmurs and the lungs are clear.  The abdomen is noted to have ttp in the left upper quadrant.  The extremities are without edema.    The workup reveals a normal ekg, trop.  As the symptoms are atypical for cardiac pain and have been ongoing since yesterday, I doubt a cardiac etiology.  She was given a GI cocktail which relieved her symptoms.  I suspect her discomfort is GI-related.  Will prescribe PPI, ensure she takes her medications with food.  To return prn.   Date: 11/12/2012  Rate: 60's  Rhythm: normal sinus rhythm  QRS Axis: normal  Intervals: normal  ST/T Wave abnormalities: normal  Conduction Disutrbances:none  Narrative Interpretation:   Old EKG Reviewed: none available    Geoffery Lyons, MD 11/12/12 [phone]

## 2013-11-29 ENCOUNTER — Emergency Department (INDEPENDENT_AMBULATORY_CARE_PROVIDER_SITE_OTHER)
Admission: EM | Admit: 2013-11-29 | Discharge: 2013-11-29 | Disposition: A | Discharge status: Nursing Home (Includes ICF) | Payer: Medicaid Other | Source: Home / Self Care | Attending: Emergency Medicine | Admitting: Emergency Medicine

## 2013-11-29 ENCOUNTER — Encounter (HOSPITAL_COMMUNITY): Payer: Self-pay | Admitting: Emergency Medicine

## 2013-11-29 ENCOUNTER — Emergency Department (HOSPITAL_COMMUNITY)
Admission: EM | Admit: 2013-11-29 | Discharge: 2013-11-30 | Disposition: A | Discharge status: Home / Self Care | Payer: Medicaid Other | Attending: Emergency Medicine | Admitting: Emergency Medicine

## 2013-11-29 DIAGNOSIS — M79643 Pain in unspecified hand: Secondary | ICD-10-CM

## 2013-11-29 DIAGNOSIS — M25512 Pain in left shoulder: Secondary | ICD-10-CM

## 2013-11-29 DIAGNOSIS — Z3202 Encounter for pregnancy test, result negative: Secondary | ICD-10-CM | POA: Diagnosis not present

## 2013-11-29 DIAGNOSIS — N390 Urinary tract infection, site not specified: Secondary | ICD-10-CM | POA: Diagnosis not present

## 2013-11-29 DIAGNOSIS — R1084 Generalized abdominal pain: Secondary | ICD-10-CM

## 2013-11-29 DIAGNOSIS — R110 Nausea: Secondary | ICD-10-CM | POA: Diagnosis not present

## 2013-11-29 DIAGNOSIS — K5900 Constipation, unspecified: Secondary | ICD-10-CM | POA: Insufficient documentation

## 2013-11-29 DIAGNOSIS — Z79899 Other long term (current) drug therapy: Secondary | ICD-10-CM | POA: Insufficient documentation

## 2013-11-29 DIAGNOSIS — M25519 Pain in unspecified shoulder: Secondary | ICD-10-CM

## 2013-11-29 DIAGNOSIS — M25511 Pain in right shoulder: Secondary | ICD-10-CM

## 2013-11-29 DIAGNOSIS — M79609 Pain in unspecified limb: Secondary | ICD-10-CM

## 2013-11-29 DIAGNOSIS — F17200 Nicotine dependence, unspecified, uncomplicated: Secondary | ICD-10-CM | POA: Insufficient documentation

## 2013-11-29 LAB — URINALYSIS, ROUTINE W REFLEX MICROSCOPIC
Bilirubin Urine: NEGATIVE
Glucose, UA: NEGATIVE mg/dL
Ketones, ur: NEGATIVE mg/dL
Nitrite: POSITIVE — AB
PROTEIN: 30 mg/dL — AB
SPECIFIC GRAVITY, URINE: 1.026 (ref 1.005–1.030)
UROBILINOGEN UA: 1 mg/dL (ref 0.0–1.0)
pH: 5.5 (ref 5.0–8.0)

## 2013-11-29 LAB — POCT URINALYSIS DIP (DEVICE)
BILIRUBIN URINE: NEGATIVE
Glucose, UA: NEGATIVE mg/dL
Ketones, ur: NEGATIVE mg/dL
Leukocytes, UA: NEGATIVE
NITRITE: POSITIVE — AB
PH: 5.5 (ref 5.0–8.0)
Protein, ur: 100 mg/dL — AB
UROBILINOGEN UA: 1 mg/dL (ref 0.0–1.0)

## 2013-11-29 LAB — COMPREHENSIVE METABOLIC PANEL
ALT: 11 U/L (ref 0–35)
ANION GAP: 13 (ref 5–15)
AST: 27 U/L (ref 0–37)
Albumin: 4.1 g/dL (ref 3.5–5.2)
Alkaline Phosphatase: 103 U/L (ref 39–117)
BUN: 15 mg/dL (ref 6–23)
CALCIUM: 9.9 mg/dL (ref 8.4–10.5)
CO2: 26 meq/L (ref 19–32)
CREATININE: 0.69 mg/dL (ref 0.50–1.10)
Chloride: 101 mEq/L (ref 96–112)
GLUCOSE: 97 mg/dL (ref 70–99)
Potassium: 3.9 mEq/L (ref 3.7–5.3)
SODIUM: 140 meq/L (ref 137–147)
TOTAL PROTEIN: 8.9 g/dL — AB (ref 6.0–8.3)
Total Bilirubin: 0.7 mg/dL (ref 0.3–1.2)

## 2013-11-29 LAB — CBC WITH DIFFERENTIAL/PLATELET
Basophils Absolute: 0 10*3/uL (ref 0.0–0.1)
Basophils Relative: 0 % (ref 0–1)
EOS ABS: 0.1 10*3/uL (ref 0.0–0.7)
EOS PCT: 2 % (ref 0–5)
HEMATOCRIT: 40.8 % (ref 36.0–46.0)
Hemoglobin: 13.2 g/dL (ref 12.0–15.0)
LYMPHS ABS: 3.3 10*3/uL (ref 0.7–4.0)
LYMPHS PCT: 42 % (ref 12–46)
MCH: 26.1 pg (ref 26.0–34.0)
MCHC: 32.4 g/dL (ref 30.0–36.0)
MCV: 80.6 fL (ref 78.0–100.0)
MONO ABS: 0.5 10*3/uL (ref 0.1–1.0)
Monocytes Relative: 7 % (ref 3–12)
Neutro Abs: 3.8 10*3/uL (ref 1.7–7.7)
Neutrophils Relative %: 49 % (ref 43–77)
PLATELETS: 202 10*3/uL (ref 150–400)
RBC: 5.06 MIL/uL (ref 3.87–5.11)
RDW: 13.4 % (ref 11.5–15.5)
WBC: 7.7 10*3/uL (ref 4.0–10.5)

## 2013-11-29 LAB — LIPASE, BLOOD: LIPASE: 25 U/L (ref 11–59)

## 2013-11-29 LAB — PREGNANCY, URINE: PREG TEST UR: NEGATIVE

## 2013-11-29 LAB — POCT PREGNANCY, URINE: Preg Test, Ur: NEGATIVE

## 2013-11-29 LAB — URINE MICROSCOPIC-ADD ON

## 2013-11-29 MED ORDER — HYDROMORPHONE HCL PF 1 MG/ML IJ SOLN
1.0000 mg | Freq: Once | INTRAMUSCULAR | Status: AC
  Administered 2013-11-29: 1 mg via INTRAMUSCULAR

## 2013-11-29 MED ORDER — IOHEXOL 300 MG/ML  SOLN
25.0000 mL | Freq: Once | INTRAMUSCULAR | Status: AC | PRN
  Administered 2013-11-29: 25 mL via ORAL

## 2013-11-29 MED ORDER — ONDANSETRON HCL 4 MG/2ML IJ SOLN
INTRAMUSCULAR | Status: AC
  Filled 2013-11-29: qty 2

## 2013-11-29 MED ORDER — ONDANSETRON HCL 4 MG/2ML IJ SOLN
4.0000 mg | Freq: Once | INTRAMUSCULAR | Status: AC
  Administered 2013-11-29: 4 mg via INTRAMUSCULAR

## 2013-11-29 MED ORDER — ONDANSETRON HCL 4 MG/2ML IJ SOLN
4.0000 mg | Freq: Once | INTRAMUSCULAR | Status: AC
  Administered 2013-11-29: 4 mg via INTRAVENOUS
  Filled 2013-11-29: qty 2

## 2013-11-29 MED ORDER — HYDROMORPHONE HCL PF 1 MG/ML IJ SOLN
1.0000 mg | Freq: Once | INTRAMUSCULAR | Status: AC
  Administered 2013-11-29: 1 mg via INTRAVENOUS
  Filled 2013-11-29: qty 1

## 2013-11-29 MED ORDER — HYDROMORPHONE HCL PF 1 MG/ML IJ SOLN
INTRAMUSCULAR | Status: AC
  Filled 2013-11-29: qty 1

## 2013-11-29 MED ORDER — SODIUM CHLORIDE 0.9 % IV BOLUS (SEPSIS)
1000.0000 mL | Freq: Once | INTRAVENOUS | Status: AC
  Administered 2013-11-29: 1000 mL via INTRAVENOUS

## 2013-11-29 NOTE — ED Provider Notes (Signed)
CSN: [id number]     Arrival date & time 11/29/13  1539 History   First MD Initiated Contact with Patient 11/29/13 2024     Chief Complaint  Patient presents with  . Abdominal Pain     (Consider location/radiation/quality/duration/timing/severity/associated sxs/prior Treatment) HPI Comments: Patient is a 48 year old female past medical history significant for tobacco abuse, marijuana abuse [facility] of periumbical pain x 2 weeks. Patient describes the pain as firey that comes in waves, 10/10 at most its most severe. Alleviating factors: none. Aggravating factors: none. Attempted to take Omeprazole. Endorse associated nausea, constipation, urinary frequency and decreased urine. Denies any fevers, chills, vaginal bleeding or discharge. History of six ectopic pregnancies.   Patient is a 48 y.o. female presenting with abdominal pain.  Abdominal Pain Associated symptoms: constipation and nausea   Associated symptoms: no dysuria, no hematuria, no vaginal bleeding and no vaginal discharge     History reviewed. No pertinent past medical history. Past Surgical History  Procedure Laterality Date  . Ectopics      pt. has had 6 ectopic pregnancies  . Ectopic pregnancy surgery      X 7 tubes were ruptured   No family history on file. History  Substance Use Topics  . Smoking status: Current Every Day Smoker  . Smokeless tobacco: Never Used  . Alcohol Use: Yes     Comment: weekends   OB History   Grav Para Term Preterm Abortions TAB SAB Ect Mult Living                 Review of Systems  Gastrointestinal: Positive for nausea, abdominal pain and constipation.  Genitourinary: Positive for frequency and decreased urine volume. Negative for dysuria, hematuria, vaginal bleeding, vaginal discharge, genital sores, vaginal pain and pelvic pain.  All other systems reviewed and are negative.     Allergies  Review of patient's  allergies indicates no known allergies.  Home Medications   Prior to Admission medications   Medication Sig Start Date End Date Taking? Authorizing Provider  naproxen sodium (ANAPROX) 220 MG tablet Take 440 mg by mouth 2 (two) times daily as needed (pain).   Yes Historical Provider, MD  omeprazole (PRILOSEC) 40 MG capsule Take 1 capsule (40 mg total) by mouth daily. 11/11/12  Yes Sherryl Manges, MD  cephALEXin (KEFLEX) 500 MG capsule Take 1 capsule (500 mg total) by mouth 2 (two) times daily. 11/30/13   Burtis Imhoff L Taneisha Fuson, PA-C  ondansetron (ZOFRAN ODT) 4 MG disintegrating tablet Take 1 tablet (4 mg total) by mouth every 8 (eight) hours as needed for nausea or vomiting. 11/30/13   Lise Auer Minyon Billiter, PA-C  oxyCODONE-acetaminophen (PERCOCET/ROXICET) 5-325 MG per tablet Take 1 tablet by mouth every 4 (four) hours as needed for severe pain. May take 2 tablets PO q 6 hours for severe pain - Do not take with Tylenol as this tablet already contains tylenol 11/30/13   Jun Rightmyer L Dorman Calderwood, PA-C   BP 125/93  Pulse 71  Temp(Src) 97.4 F (36.3 C) (Oral)  Resp 18  SpO2 98% Physical Exam  Nursing note and vitals reviewed. Constitutional: She is oriented to person, place, and time. She appears well-developed and well-nourished. No distress.  HENT:  Head: Normocephalic and atraumatic.  Right Ear: External ear normal.  Left Ear: External ear normal.  Nose: Nose normal.  Mouth/Throat: Oropharynx is clear and moist.  Eyes: Conjunctivae are normal.  Neck: Normal range of motion. Neck supple.  Cardiovascular: Normal rate, regular rhythm and normal heart sounds.   Pulmonary/Chest: Effort normal and breath sounds normal.  Abdominal: Soft. Normal appearance and bowel sounds are normal. She exhibits no distension. There is tenderness in the right lower quadrant. There is guarding and tenderness at McBurney's point. There is no rigidity, no rebound and no CVA tenderness.  Musculoskeletal: Normal range of  motion.  Neurological: She is alert and oriented to person, place, and time.  Skin: Skin is warm and dry. She is not diaphoretic.  Psychiatric: She has a normal mood and affect.    ED Course  Procedures (including critical care time) Medications  sodium chloride 0.9 % bolus 1,000 mL (0 mLs Intravenous Stopped 11/30/13 0112)  HYDROmorphone (DILAUDID) injection 1 mg (1 mg Intravenous Given 11/29/13 2121)  ondansetron (ZOFRAN) injection 4 mg (4 mg Intravenous Given 11/29/13 2122)  iohexol (OMNIPAQUE) 300 MG/ML solution 25 mL (25 mLs Oral Contrast Given 11/29/13 2131)  iohexol (OMNIPAQUE) 300 MG/ML solution 100 mL (100 mLs Intravenous Contrast Given 11/30/13 0007)    Labs Review Labs Reviewed  COMPREHENSIVE METABOLIC PANEL - Abnormal; Notable for the following:    Total Protein 8.9 (*)    All other components within normal limits  URINALYSIS, ROUTINE W REFLEX MICROSCOPIC - Abnormal; Notable for the following:    Color, Urine AMBER (*)    APPearance TURBID (*)    Hgb urine dipstick LARGE (*)    Protein, ur 30 (*)    Nitrite POSITIVE (*)    Leukocytes, UA SMALL (*)    All other components within normal limits  URINE MICROSCOPIC-ADD ON - Abnormal; Notable for the following:    Squamous Epithelial / LPF FEW (*)    Casts BACTERIAL CASTS (*)    All other components within normal limits  CBC WITH DIFFERENTIAL  LIPASE, BLOOD  PREGNANCY, URINE    Imaging Review Ct Abdomen Pelvis W Contrast  11/30/2013   CLINICAL DATA:  Abdominal pain.  EXAM: CT ABDOMEN AND PELVIS WITH CONTRAST  TECHNIQUE: Multidetector CT imaging of the abdomen and pelvis was performed using the standard protocol following bolus administration of intravenous contrast.  CONTRAST:  OMNIPAQUE IOHEXOL 300 MG/ML  SOLN  COMPARISON:  None.  FINDINGS: The lung bases are clear.  The solid abdominal organs are normal. Gallbladder is normal. No common bile duct dilatation. Small renal cysts are noted. No hydronephrosis.  The  stomach, duodenum and small bowel are unremarkable. No inflammatory changes, mass lesions or obstructive findings. Moderate scattered colonic diverticulosis without findings for acute diverticulitis. Suspect mild inflammation of the ascending colon with adjacent borderline enlarged pericecal lymph nodes. The appendix is normal. No mesenteric or retroperitoneal mass or adenopathy. A retroaortic left renal vein is noted. The aorta is normal in caliber. The major branch vessels are patent. The major venous structures are patent.  The uterus demonstrates multiple fibroids. The ovaries are normal. The bladder is normal. No pelvic mass or adenopathy. No free pelvic fluid collections. No inguinal mass or adenopathy.  The bony structures are unremarkable.  IMPRESSION: 1. Suspect mild inflammation of the ascending colon with adjacent borderline enlarged lymph nodes. 2. No other significant findings.   Electronically Signed   By: Loralie Champagne M.D.   On: 11/30/2013 00:44     EKG Interpretation None      MDM   Final diagnoses:  Generalized abdominal pain  UTI (lower urinary tract infection)    Filed Vitals:   11/30/13 0111  BP:   Pulse: 71  Temp:   Resp:     Afebrile, NAD, non-toxic appearing, AAOx4.   1) Abdominal pain: Patient is nontoxic, nonseptic appearing, in no apparent distress.  Patient's pain and other symptoms adequately managed in emergency department.  Fluid bolus given.  Labs, imaging and vitals reviewed.  Patient does not meet the SIRS or Sepsis criteria.  On repeat exam patient does not have a surgical abdomen and there are nor peritoneal signs.  No indication of appendicitis, bowel obstruction, bowel perforation, cholecystitis, diverticulitis, PID or ectopic pregnancy. Mild inflammation of ascending colon noted on CT scan.   2) UTI: Pt has been diagnosed with a UTI. Pt is afebrile, no CVA tenderness, normotensive, and denies N/V. Pt to be dc home with antibiotics.   Patient  discharged home with symptomatic treatment and given strict instructions for follow-up with their primary care physician.  I have also discussed reasons to return immediately to the ER.  Patient expresses understanding and agrees with plan.    Jeannetta EllisJennifer L Aysiah Jurado, PA-C 11/30/13 [phone]

## 2013-11-29 NOTE — ED Notes (Signed)
Pt presents to department for evaluation of abdominal pain. Ongoing x2 weeks. [facility]. Denies pain upon arrival. Pt is alert and oriented x4.

## 2013-11-29 NOTE — ED Notes (Signed)
C/o bilateral shoulder and hand pain onset 1 week Denies inj/trauma; unable to make fist due to pain Also c/o constant abd pain; hx of gastritis and GERD Denies urinary sx, gyn sx, f/v/n/d Alert, no signs of acute distress.

## 2013-11-29 NOTE — Discharge Instructions (Signed)
We have determined that your problem requires further evaluation in the emergency department.  We will take care of your transport there.  Once at the emergency department, you will be evaluated by a provider and they will order whatever treatment or tests they deem necessary.  We cannot guarantee that they will do any specific test or do any specific treatment.  ° °

## 2013-11-29 NOTE — ED Provider Notes (Signed)
Chief Complaint   Chief Complaint  Patient presents with  . Shoulder Pain  . Hand Pain  . Abdominal Pain    History of Present Illness   Alison Sanchez is a 48 year old female who presents with a one-week history of periumbilical abdominal pain. It feels like a gripping pain and like her stomach is on fire. The pain is rated 10 over 10 in intensity and is very severe. It comes and goes. It's worse at nighttime and better she sits up. It's not related to meals or any specific foods and not relieved by antacids, TUMS, Rolaids, or Pepto-Bismol. The patient is actually had similar pains off and on for about a year. She's been diagnosed with acid reflux and gastritis, but never had an endoscopy or x-rays. She was given omeprazole 40 mg a day and has been taking this without relief. She denies any fever, chills, nausea, vomiting, anorexia, weight loss. The pain does not radiate to the back. She's had no constipation, diarrhea, melena, or hematochezia. She denies any urinary or GYN symptoms. She is postmenopausal. Her urine has been somewhat dark in color. She also has a couple of other complaints that may or may not be related including a two-week history of numbness of her hands, swelling of the palms of the hands, and aching the palms of the hands. She also had a one-week history of bilateral shoulder pain this is worse at nighttime. She denies any injury. She has no history of ulcers, or bladder disease, or colitis.  Review of Systems   Other than as noted above, the patient denies any of the following symptoms: Constitutional:  No fever, chills, weight loss or anorexia. Abdomen:  No nausea, vomiting, hematememesis, melena, diarrhea, or hematochezia. GU:  No dysuria, frequency, urgency, or hematuria.  Gyn:  No vaginal discharge, itching, abnormal bleeding, dyspareunia, or pelvic pain.  PMFSH   Past medical history, family history, social history, meds, and allergies were reviewed. She smokes one  pack of cigarettes a day and drinks occasional alcohol.  Physical Exam     Vital signs:  BP 118/79  Pulse 83  Temp(Src) 98.1 F (36.7 C) (Oral)  Resp 20  SpO2 97% Gen:  Alert, oriented, in no distress. Lungs:  Breath sounds clear and equal bilaterally.  No wheezes, rales or rhonchi. Heart:  Regular rhythm.  No gallops or murmers.   Abdomen:  Soft, flat, nondistended. She has tenderness with guarding and rebound in the right lower quadrant, right flank, and right upper quadrant with positive Murphy sign and Murphy's punch. Bowel sounds were normal. Extremities: Exam of the hands are unremarkable with no obvious swelling or erythema. There was no pain to palpation. All joints have full range of motion with no pain. Examine the shoulders reveals no pain to palpation and full range of motion with no pain. Skin:  Clear, warm and dry.  No rash.  Labs   Results for orders placed during the hospital encounter of 11/29/13  POCT URINALYSIS DIP (DEVICE)      Result Value Ref Range   Glucose, UA NEGATIVE  NEGATIVE mg/dL   Bilirubin Urine NEGATIVE  NEGATIVE   Ketones, ur NEGATIVE  NEGATIVE mg/dL   Specific Gravity, Urine >=1.030  1.005 - 1.030   Hgb urine dipstick MODERATE (*) NEGATIVE   pH 5.5  5.0 - 8.0   Protein, ur 100 (*) NEGATIVE mg/dL   Urobilinogen, UA 1.0  0.0 - 1.0 mg/dL   Nitrite POSITIVE (*) NEGATIVE   Leukocytes,  UA NEGATIVE  NEGATIVE  POCT PREGNANCY, URINE      Result Value Ref Range   Preg Test, Ur NEGATIVE  NEGATIVE   Course in Urgent Care Center   The following medications were given:  Medications  HYDROmorphone (DILAUDID) injection 1 mg (not administered)  ondansetron (ZOFRAN) injection 4 mg (not administered)   Assessment   The primary encounter diagnosis was Generalized abdominal pain. Diagnoses of Pain of hand, unspecified laterality and Bilateral shoulder pain were also pertinent to this visit.  I am most concerned about appendicitis, diverticulitis, acute  cholecystitis, or pyelonephritis. Other possibilities include gastritis, ulcer disease, reflux esophagitis, or pancreatitis. I do not think the arthralgias are related to the abdominal pain and may be a separate issue.  Plan     The patient was transferred to the ED via shuttle in stable condition.  Medical Decision Making: 48 year old female has a 3 day history of severe 10/10 peri umbilical pain.  No fever, nausea, vomiting, diarrhea, hematochezia, urinary or GYN complaints.  Her UA dipstick is positive for Hg and nitrites with a negative pregnancy test.  On exam she is very tender in RLQ, right flank and RUQ with a positive Murphy's sign and Murphy's punch.  I am concerned about cholecystitis, appendicitis, or pyelonephritis.  Needs further eval.  We will give hydromorphone 1 mg IM and Zofran 4 mg IM.       Reuben Likes, MD 11/29/13 [phone]

## 2013-11-30 ENCOUNTER — Encounter (HOSPITAL_COMMUNITY): Payer: Self-pay | Admitting: Radiology

## 2013-11-30 ENCOUNTER — Emergency Department (HOSPITAL_COMMUNITY): Payer: Medicaid Other

## 2013-11-30 MED ORDER — IOHEXOL 300 MG/ML  SOLN
100.0000 mL | Freq: Once | INTRAMUSCULAR | Status: AC | PRN
  Administered 2013-11-30: 100 mL via INTRAVENOUS

## 2013-11-30 MED ORDER — OXYCODONE-ACETAMINOPHEN 5-325 MG PO TABS: 1.0000 | ORAL_TABLET | ORAL | Status: DC | PRN

## 2013-11-30 MED ORDER — ONDANSETRON 4 MG PO TBDP: 4.0000 mg | ORAL_TABLET | Freq: Three times a day (TID) | ORAL | Status: DC | PRN

## 2013-11-30 MED ORDER — CEPHALEXIN 500 MG PO CAPS: 500.0000 mg | ORAL_CAPSULE | Freq: Two times a day (BID) | ORAL | Status: DC

## 2013-11-30 NOTE — ED Provider Notes (Signed)
Medical screening examination/treatment/procedure(s) were performed by non-physician practitioner and as supervising physician I was immediately available for consultation/collaboration.   Trapper Meech T Jenipher Havel, MD 11/30/13 2253 

## 2013-11-30 NOTE — ED Notes (Signed)
Patient transported to CT 

## 2013-11-30 NOTE — Discharge Instructions (Signed)
Please follow up with your primary care physician in 1-2 days. If you do not have one please call the [facility] number listed above. Please take your antibiotic until completion. Please take pain medication and/or muscle relaxants as prescribed and as needed for pain. Please do not drive on narcotic pain medication or on muscle relaxants. Please read all discharge instructions and return precautions.    Abdominal Pain Many things can cause abdominal pain. Usually, abdominal pain is not caused by a disease and will improve without treatment. It can often be observed and treated at home. Your health care provider will do a physical exam and possibly order blood tests and X-rays to help determine the seriousness of your pain. However, in many cases, more time must pass before a clear cause of the pain can be found. Before that point, your health care provider may not know if you need more testing or further treatment. HOME CARE INSTRUCTIONS  Monitor your abdominal pain for any changes. The following actions may help to alleviate any discomfort you are experiencing:  Only take over-the-counter or prescription medicines as directed by your health care provider.  Do not take laxatives unless directed to do so by your health care provider.  Try a clear liquid diet (broth, tea, or water) as directed by your health care provider. Slowly move to a bland diet as tolerated. SEEK MEDICAL CARE IF:  You have unexplained abdominal pain.  You have abdominal pain associated with nausea or diarrhea.  You have pain when you urinate or have a bowel movement.  You experience abdominal pain that wakes you in the night.  You have abdominal pain that is worsened or improved by eating food.  You have abdominal pain that is worsened with eating fatty foods.  You have a fever. SEEK IMMEDIATE MEDICAL CARE IF:   Your pain does not go away within 2 hours.  You keep throwing up  (vomiting).  Your pain is felt only in portions of the abdomen, such as the right side or the left lower portion of the abdomen.  You pass bloody or black tarry stools. MAKE SURE YOU:  Understand these instructions.   Will watch your condition.   Will get help right away if you are not doing well or get worse.  Document Released: 01/07/2005 Document Revised: 04/04/2013 Document Reviewed: 12/07/2012 [facility] Patient Information 2015 [place], [place]. This information is not intended to replace advice given to you by your health care provider. Make sure you discuss any questions you have with your health care provider. Urinary Tract Infection Urinary tract infections (UTIs) can develop anywhere along your urinary tract. Your urinary tract is your body's drainage system for removing wastes and extra water. Your urinary tract includes two kidneys, two ureters, a bladder, and a urethra. Your kidneys are a pair of bean-shaped organs. Each kidney is about the size of your fist. They are located below your ribs, one on each side of your spine. CAUSES Infections are caused by microbes, which are microscopic organisms, including fungi, viruses, and bacteria. These organisms are so small that they can only be seen through a microscope. Bacteria are the microbes that most commonly cause UTIs. SYMPTOMS  Symptoms of UTIs may vary by age and gender of the patient and by the location of the infection. Symptoms in young women typically include a frequent and intense urge to urinate and a painful, burning feeling in the bladder or urethra during urination. Older women and men  are more likely to be tired, shaky, and weak and have muscle aches and abdominal pain. A fever may mean the infection is in your kidneys. Other symptoms of a kidney infection include pain in your back or sides below the ribs, nausea, and vomiting. DIAGNOSIS To diagnose a UTI, your caregiver will ask you about your symptoms. Your caregiver  also will ask to provide a urine sample. The urine sample will be tested for bacteria and white blood cells. White blood cells are made by your body to help fight infection. TREATMENT  Typically, UTIs can be treated with medication. Because most UTIs are caused by a bacterial infection, they usually can be treated with the use of antibiotics. The choice of antibiotic and length of treatment depend on your symptoms and the type of bacteria causing your infection. HOME CARE INSTRUCTIONS  If you were prescribed antibiotics, take them exactly as your caregiver instructs you. Finish the medication even if you feel better after you have only taken some of the medication.  Drink enough water and fluids to keep your urine clear or pale yellow.  Avoid caffeine, tea, and carbonated beverages. They tend to irritate your bladder.  Empty your bladder often. Avoid holding urine for long periods of time.  Empty your bladder before and after sexual intercourse.  After a bowel movement, women should cleanse from front to back. Use each tissue only once. SEEK MEDICAL CARE IF:   You have back pain.  You develop a fever.  Your symptoms do not begin to resolve within 3 days. SEEK IMMEDIATE MEDICAL CARE IF:   You have severe back pain or lower abdominal pain.  You develop chills.  You have nausea or vomiting.  You have continued burning or discomfort with urination. MAKE SURE YOU:   Understand these instructions.  Will watch your condition.  Will get help right away if you are not doing well or get worse. Document Released: 01/07/2005 Document Revised: 09/29/2011 Document Reviewed: 05/08/2011 [facility] Patient Information 2015 [place]. This information is not intended to replace advice given to you by your health care provider. Make sure you discuss any questions you have with your health care provider. Clear Liquid Diet A clear liquid diet is a short-term diet that is prescribed to  provide the necessary fluid and basic energy you need when you can have nothing else. The clear liquid diet consists of liquids or solids that will become liquid at room temperature. You should be able to see through the liquid. There are many reasons that you may be restricted to clear liquids, such as:  When you have a sudden-onset (acute) condition that occurs before or after surgery.  To help your body slowly get adjusted to food again after a long period when you were unable to have food.  Replacement of fluids when you have a diarrheal disease.  When you are going to have certain exams, such as a colonoscopy, in which instruments are inserted inside your body to look at parts of your digestive system. WHAT CAN I HAVE? A clear liquid diet does not provide all the nutrients you need. It is important to choose a variety of the following items to get as many nutrients as possible:  Vegetable juices that do not have pulp.  Fruit juices and fruit drinks that do not have pulp.  Coffee (regular or decaffeinated), tea, or soda at the discretion of your health care provider.  Clear bouillon, broth, or strained broth-based soups.  High-protein and flavored  gelatins.  Sugar or honey.  Ices or frozen ice pops that do not contain milk. If you are not sure whether you can have certain items, you should ask your health care provider. You may also ask your health care provider if there are any other clear liquid options. Document Released: 03/30/2005 Document Revised: 04/04/2013 Document Reviewed: 02/24/2013 [facility] Patient Information 2015 [place], [place]. This information is not intended to replace advice given to you by your health care provider. Make sure you discuss any questions you have with your health care provider.

## 2014-03-11 ENCOUNTER — Encounter (HOSPITAL_COMMUNITY): Payer: Self-pay | Admitting: Emergency Medicine

## 2014-03-11 ENCOUNTER — Emergency Department (INDEPENDENT_AMBULATORY_CARE_PROVIDER_SITE_OTHER)
Admission: EM | Admit: 2014-03-11 | Discharge: 2014-03-11 | Disposition: A | Discharge status: Home / Self Care | Payer: Medicaid Other | Source: Home / Self Care | Attending: Emergency Medicine | Admitting: Emergency Medicine

## 2014-03-11 DIAGNOSIS — M6588 Other synovitis and tenosynovitis, other site: Secondary | ICD-10-CM

## 2014-03-11 DIAGNOSIS — R202 Paresthesia of skin: Secondary | ICD-10-CM

## 2014-03-11 DIAGNOSIS — M79641 Pain in right hand: Secondary | ICD-10-CM

## 2014-03-11 DIAGNOSIS — G8929 Other chronic pain: Secondary | ICD-10-CM

## 2014-03-11 DIAGNOSIS — M778 Other enthesopathies, not elsewhere classified: Secondary | ICD-10-CM

## 2014-03-11 DIAGNOSIS — M79642 Pain in left hand: Secondary | ICD-10-CM

## 2014-03-11 DIAGNOSIS — M779 Enthesopathy, unspecified: Principal | ICD-10-CM

## 2014-03-11 DIAGNOSIS — M25539 Pain in unspecified wrist: Secondary | ICD-10-CM

## 2014-03-11 MED ORDER — DICLOFENAC POTASSIUM 50 MG PO TABS: 50.0000 mg | ORAL_TABLET | Freq: Three times a day (TID) | ORAL | Status: DC

## 2014-03-11 MED ORDER — KETOROLAC TROMETHAMINE 60 MG/2ML IM SOLN
INTRAMUSCULAR | Status: AC
  Filled 2014-03-11: qty 2

## 2014-03-11 MED ORDER — DEXAMETHASONE SODIUM PHOSPHATE 10 MG/ML IJ SOLN
INTRAMUSCULAR | Status: AC
  Filled 2014-03-11: qty 1

## 2014-03-11 MED ORDER — DEXAMETHASONE SODIUM PHOSPHATE 10 MG/ML IJ SOLN
10.0000 mg | Freq: Once | INTRAMUSCULAR | Status: AC
  Administered 2014-03-11: 10 mg via INTRAMUSCULAR

## 2014-03-11 MED ORDER — KETOROLAC TROMETHAMINE 60 MG/2ML IM SOLN
45.0000 mg | Freq: Once | INTRAMUSCULAR | Status: AC
  Administered 2014-03-11: 45 mg via INTRAMUSCULAR

## 2014-03-11 NOTE — ED Provider Notes (Signed)
CSN: [id number]     Arrival date & time 03/11/14  1603 History   First MD Initiated Contact with Patient 03/11/14 1611     No chief complaint on file.  (Consider location/radiation/quality/duration/timing/severity/associated sxs/prior Treatment) HPI Comments: 48 year old female complaining of bilateral hand and wrist pain for 2-3 months. She has decreased mobility in the digits. Clear of the right thumb. She also complains of needles and pins in the digits along with paresthesias. She recently obtained a job at [employer]. Denies any known recent injury. She does state that approximately 1 year ago while she was drunk she fell onto her hands and hyper flexed the wrist.   No past medical history on file. Past Surgical History  Procedure Laterality Date  . Ectopics      pt. has had 6 ectopic pregnancies  . Ectopic pregnancy surgery      X 7 tubes were ruptured   No family history on file. History  Substance Use Topics  . Smoking status: Current Every Day Smoker  . Smokeless tobacco: Never Used  . Alcohol Use: Yes     Comment: weekends   OB History    No data available     Review of Systems  Constitutional: Negative for fever, chills and fatigue.  Respiratory: Negative.   Cardiovascular: Negative.   Genitourinary: Negative.   Musculoskeletal: Positive for arthralgias. Negative for back pain and neck pain.  Skin: Negative.   Neurological: Positive for numbness. Negative for tremors.    Allergies  Review of patient's allergies indicates no known allergies.  Home Medications   Prior to Admission medications   Medication Sig Start Date End Date Taking? Authorizing Provider  diclofenac (CATAFLAM) 50 MG tablet Take 1 tablet (50 mg total) by mouth 3 (three) times daily. One tablet TID with food prn pain. 03/11/14   Hayden Rasmussenavid Sussan Meter, NP  naproxen sodium (ANAPROX) 220 MG tablet Take 440 mg by mouth 2 (two) times daily as needed (pain).    Historical Provider, MD  omeprazole (PRILOSEC)  40 MG capsule Take 1 capsule (40 mg total) by mouth daily. 11/11/12   Sherryl MangesSamuel Ritter, MD  ondansetron (ZOFRAN ODT) 4 MG disintegrating tablet Take 1 tablet (4 mg total) by mouth every 8 (eight) hours as needed for nausea or vomiting. 11/30/13   Victorino DikeJennifer L Piepenbrink, PA-C   BP 114/74 mmHg  Pulse 95  Temp(Src) 98 F (36.7 C) (Oral)  Resp 20  SpO2 97% Physical Exam  Constitutional: She is oriented to person, place, and time. She appears well-developed and well-nourished. No distress.  Eyes: EOM are normal.  Neck: Normal range of motion. Neck supple.  Pulmonary/Chest: Effort normal. No respiratory distress.  Musculoskeletal: She exhibits no edema.  Tenderness to the right thumb. Positive Finkelstein sign on the right. Tenderness to the MCPs and limited flexion of the digits. Left hand with pain in the wrist, digits. Also a decreased flexion due to pain in the digits. Negative Finkelstein. Bilateral hands with capillary refill less than 3 seconds. Patient has swelling no deformity, no discoloration. There is atrophy of the right thenar eminence. Phalen sign is equivocal. Tinel sign positive on the right. Bilateral radial pulses 2+.  Neurological: She is alert and oriented to person, place, and time. No cranial nerve deficit.  Skin: Skin is warm and dry.  Nursing note and vitals reviewed.   ED Course  Procedures (including critical care time) Labs Review Labs Reviewed - No data to display  Imaging Review No results found.   MDM  1. Tendinitis of thumb   2. Bilateral hand pain   3. Chronic wrist pain, unspecified laterality   4. Paresthesia of hand, bilateral    Musculoskeletal pain with likely nerve compression R>L, consider carpal tunnel syndrome. Splint right thumb and wrist. Probably needs NCS Ice locally Toradol 45 mg IM and Decadron 10 mg IM now Cataflam 50 mg per Rx. See PCP ASAP     Hayden Rasmussenavid Lia Vigilante, NP 03/11/14 1642

## 2014-03-11 NOTE — Discharge Instructions (Signed)
Carpal Tunnel Syndrome The carpal tunnel is an area under the skin of the palm of your hand. Nerves, blood vessels, and strong tissues (tendons) pass through the tunnel. The tunnel can become puffy (swollen). If this happens, a nerve can be pinched in the wrist. This causes carpal tunnel syndrome.  HOME CARE  Take all medicine as told by your doctor.  If you were given a splint, wear it as told. Wear it at night or at times when your doctor told you to.  Rest your wrist from the activity that causes your pain.  Put ice on your wrist after long periods of wrist activity.  Put ice in a plastic bag.  Place a towel between your skin and the bag.  Leave the ice on for 15-20 minutes, 03-04 times a day.  Keep all doctor visits as told. GET HELP RIGHT AWAY IF:  You have new problems you cannot explain.  Your problems get worse and medicine does not help. MAKE SURE YOU:   Understand these instructions.  Will watch your condition.  Will get help right away if you are not doing well or get worse. Document Released: 03/19/2011 Document Revised: 06/22/2011 Document Reviewed: 03/19/2011 [facility] Patient Information 2015 [place], [place]. This information is not intended to replace advice given to you by your health care provider. Make sure you discuss any questions you have with your health care provider.  Tendinitis  Tendinitis is redness, soreness, and puffiness (inflammation) of the tendons. Tendons are band-like tissues that connect muscle to bone. Tendinitis often happens in the shoulders, heels, or elbows. It might happen if your job involves doing the same motions over and over. HOME CARE  Use a sling or splint as told by your doctor.  Put ice on the injured area.  Put ice in a plastic bag.  Place a towel between your skin and the bag.  Leave the ice on for 15-20 minutes, 03-04 times a day.  Avoid using your injured arm or leg until the pain goes away.  Do gentle exercises  only as told by your doctor. Stop exercises if the pain gets worse, unless your doctor tells you otherwise.  Only take medicines as told by your doctor. GET HELP RIGHT AWAY IF:  Your pain and puffiness get worse.  You have new problems, such as loss of feeling (numbness) in the hands. MAKE SURE YOU:  Understand these instructions.  Will watch your condition.  Will get help right away if you are not doing well or get worse. Document Released: 07/10/2010 Document Revised: 06/22/2011 Document Reviewed: 07/10/2010 [facility] Patient Information 2015 [place], [place]. This information is not intended to replace advice given to you by your health care provider. Make sure you discuss any questions you have with your health care provider.  Wrist Pain A wrist sprain happens when the bands of tissue that hold the wrist joints together (ligament) stretch too much or tear. A wrist strain happens when muscles or bands of tissue that connect muscles to bones (tendons) are stretched or pulled. HOME CARE  Put ice on the injured area.  Put ice in a plastic bag.  Place a towel between your skin and the bag.  Leave the ice on for 15-20 minutes, 03-04 times a day, for the first 2 days.  Raise (elevate) the injured wrist to lessen puffiness (swelling).  Rest the injured wrist for at least 48 hours or as told by your doctor.  Wear a splint, cast, or an elastic wrap  as told by your doctor.  Only take medicine as told by your doctor.  Follow up with your doctor as told. This is important. GET HELP RIGHT AWAY IF:   The fingers are puffy, very red, white, or cold and blue.  The fingers lose feeling (numb) or tingle.  The pain gets worse.  It is hard to move the fingers. MAKE SURE YOU:   Understand these instructions.  Will watch your condition.  Will get help right away if you are not doing well or get worse. Document Released: 09/16/2007 Document Revised: 06/22/2011 Document Reviewed:  05/21/2010 [facility] Patient Information 2015 [place], [place]. This information is not intended to replace advice given to you by your health care provider. Make sure you discuss any questions you have with your health care provider.

## 2014-03-11 NOTE — ED Notes (Signed)
Pt states that she has been having right thumb and wrist pain for over 2 months

## 2015-12-20 ENCOUNTER — Ambulatory Visit (HOSPITAL_COMMUNITY)
Admission: EM | Admit: 2015-12-20 | Discharge: 2015-12-20 | Disposition: A | Discharge status: Home / Self Care | Payer: Medicaid Other | Attending: Family Medicine | Admitting: Family Medicine

## 2015-12-20 ENCOUNTER — Encounter (HOSPITAL_COMMUNITY): Payer: Self-pay | Admitting: Family Medicine

## 2015-12-20 DIAGNOSIS — K2100 Gastro-esophageal reflux disease with esophagitis, without bleeding: Secondary | ICD-10-CM

## 2015-12-20 DIAGNOSIS — Z720 Tobacco use: Secondary | ICD-10-CM

## 2015-12-20 DIAGNOSIS — F17200 Nicotine dependence, unspecified, uncomplicated: Secondary | ICD-10-CM

## 2015-12-20 DIAGNOSIS — R072 Precordial pain: Secondary | ICD-10-CM

## 2015-12-20 MED ORDER — OMEPRAZOLE 20 MG PO CPDR: 20.0000 mg | DELAYED_RELEASE_CAPSULE | Freq: Two times a day (BID) | ORAL | 3 refills | Status: DC

## 2015-12-20 MED ORDER — GI COCKTAIL ~~LOC~~
ORAL | Status: AC
  Filled 2015-12-20: qty 30

## 2015-12-20 MED ORDER — GI COCKTAIL ~~LOC~~
30.0000 mL | Freq: Once | ORAL | Status: AC
  Administered 2015-12-20: 30 mL via ORAL

## 2015-12-20 MED ORDER — VARENICLINE TARTRATE 0.5 MG PO TABS: 0.5000 mg | ORAL_TABLET | Freq: Two times a day (BID) | ORAL | 3 refills | Status: DC

## 2015-12-20 MED ORDER — ONDANSETRON 8 MG PO TBDP: 8.0000 mg | ORAL_TABLET | Freq: Three times a day (TID) | ORAL | 3 refills | Status: DC | PRN

## 2015-12-20 NOTE — ED Triage Notes (Signed)
Pt here for chest pain that started today at work. sts that she has severe acid reflux and gastritis and takes ranitidine. sts the pain is worse with deep breathing and palpation. With palpation pt almost jumped off the bed. sts that she is out of her omeprazole that usually works.

## 2015-12-20 NOTE — ED Provider Notes (Signed)
[facility]    CSN: [id number] Arrival date & time: 12/20/15  1606  First Provider Contact:  First MD Initiated Contact with Patient 12/20/15 1624        History   Chief Complaint Chief Complaint  Patient presents with  . Chest Pain    HPI Alison Sanchez is a 50 y.o. female.   This is a 50 yo woman who works at [employer].  She developed precordial chest pain 8 hours ago, which has continued despite taking an aspirin and "acid reducer".    She has a long h/o chest pain which has felt just like this.  She was taking omeprazole and ranitidine in the past.  She has also been a smoker and the Chantix was helping (it did require an anti-nausea medication) her quit.  She has had no shortness of breath, nausea, or leg pain  She has had some diaphoresis.    No h/o elevated cholesterol, hypertension or diabetes.      History reviewed. No pertinent past medical history.  There are no active problems to display for this patient.   Past Surgical History:  Procedure Laterality Date  . ECTOPIC PREGNANCY SURGERY     X 7 tubes were ruptured  . ectopics     pt. has had 6 ectopic pregnancies    OB History    No data available       Home Medications    Prior to Admission medications   Medication Sig Start Date End Date Taking? Authorizing Provider  omeprazole (PRILOSEC) 20 MG capsule Take 1 capsule (20 mg total) by mouth 2 (two) times daily before a meal. 12/20/15   Elvina Sidle, MD  ondansetron (ZOFRAN-ODT) 8 MG disintegrating tablet Take 1 tablet (8 mg total) by mouth every 8 (eight) hours as needed for nausea. 12/20/15   Elvina Sidle, MD  varenicline (CHANTIX) 0.5 MG tablet Take 1 tablet (0.5 mg total) by mouth 2 (two) times daily. 12/20/15   Elvina Sidle, MD    Family History History reviewed. No pertinent family history.  Social History Social History  Substance Use Topics  . Smoking status: Current Every Day Smoker    Packs/day: 1.00    Types:  Cigarettes  . Smokeless tobacco: Never Used  . Alcohol use Yes     Comment: weekends     Allergies   Review of patient's allergies indicates no known allergies.   Review of Systems Review of Systems  Constitutional: Positive for diaphoresis. Negative for activity change, appetite change, fatigue and fever.  HENT: Negative.   Eyes: Negative.   Respiratory: Negative for chest tightness, shortness of breath and wheezing.   Cardiovascular: Positive for chest pain.  Gastrointestinal: Negative.   Musculoskeletal: Negative.   Allergic/Immunologic: Negative for immunocompromised state.  Neurological: Negative.   Psychiatric/Behavioral: Negative for agitation.     Physical Exam Triage Vital Signs ED Triage Vitals  Enc Vitals Group     BP 12/20/15 1616 117/76     Pulse Rate 12/20/15 1616 69     Resp 12/20/15 1616 18     Temp 12/20/15 1616 97.5 F (36.4 C)     Temp src --      SpO2 12/20/15 1616 100 %     Weight --      Height --      Head Circumference --      Peak Flow --      Pain Score 12/20/15 1617 10     Pain Loc --  Pain Edu? --      Excl. in GC? --    No data found.   Updated Vital Signs BP 117/76   Pulse 69   Temp 97.5 F (36.4 C)   Resp 18   SpO2 100%   Visual Acuity Right Eye Distance:   Left Eye Distance:   Bilateral Distance:    Right Eye Near:   Left Eye Near:    Bilateral Near:     Physical Exam  Constitutional: She appears well-developed and well-nourished. She appears distressed.  HENT:  Head: Normocephalic and atraumatic.  Right Ear: External ear normal.  Left Ear: External ear normal.  Nose: Nose normal.  Mouth/Throat: Oropharynx is clear and moist.  Eyes: Conjunctivae and EOM are normal. Pupils are equal, round, and reactive to light.  Neck: Normal range of motion. Neck supple.  Cardiovascular: Normal rate, regular rhythm, normal heart sounds and intact distal pulses.   Pulmonary/Chest: Effort normal and breath sounds normal.    Neurological: She is alert.  Skin: Skin is warm and dry. She is not diaphoretic.  Psychiatric: Judgment and thought content normal.  Nursing note and vitals reviewed.    UC Treatments / Results  Labs (all labs ordered are listed, but only abnormal results are displayed) Labs Reviewed - No data to display  EKG  EKG Interpretation None       Radiology No results found.  Procedures Procedures (including critical care time)  Medications Ordered in UC Medications  gi cocktail (Maalox,Lidocaine,Donnatal) (30 mLs Oral Given 12/20/15 1635)     Initial Impression / Assessment and Plan / UC Course  I have reviewed the triage vital signs and the nursing notes.  Pertinent labs & imaging results that were available during my care of the patient were reviewed by me and considered in my medical decision making (see chart for details).  Clinical Course   Patient saw significantly less discomfort in her chest after the Magic mouthwash. EKG showed no acute changes.  Final Clinical Impressions(s) / UC Diagnoses   Final diagnoses:  Precordial pain  Smoking  Gastroesophageal reflux disease with esophagitis    New Prescriptions New Prescriptions   OMEPRAZOLE (PRILOSEC) 20 MG CAPSULE    Take 1 capsule (20 mg total) by mouth 2 (two) times daily before a meal.   ONDANSETRON (ZOFRAN-ODT) 8 MG DISINTEGRATING TABLET    Take 1 tablet (8 mg total) by mouth every 8 (eight) hours as needed for nausea.   VARENICLINE (CHANTIX) 0.5 MG TABLET    Take 1 tablet (0.5 mg total) by mouth 2 (two) times daily.     Elvina SidleKurt Tomoki Lucken, MD 12/20/15 1705

## 2016-03-29 ENCOUNTER — Emergency Department (HOSPITAL_COMMUNITY)
Admission: EM | Admit: 2016-03-29 | Discharge: 2016-03-29 | Disposition: A | Discharge status: Home / Self Care | Payer: Medicaid Other | Attending: Emergency Medicine | Admitting: Emergency Medicine

## 2016-03-29 DIAGNOSIS — N12 Tubulo-interstitial nephritis, not specified as acute or chronic: Secondary | ICD-10-CM | POA: Insufficient documentation

## 2016-03-29 DIAGNOSIS — F17210 Nicotine dependence, cigarettes, uncomplicated: Secondary | ICD-10-CM | POA: Insufficient documentation

## 2016-03-29 LAB — CBC
HCT: 37.7 % (ref 36.0–46.0)
Hemoglobin: 12.9 g/dL (ref 12.0–15.0)
MCH: 26.9 pg (ref 26.0–34.0)
MCHC: 34.2 g/dL (ref 30.0–36.0)
MCV: 78.7 fL (ref 78.0–100.0)
PLATELETS: 202 10*3/uL (ref 150–400)
RBC: 4.79 MIL/uL (ref 3.87–5.11)
RDW: 13.6 % (ref 11.5–15.5)
WBC: 12.1 10*3/uL — AB (ref 4.0–10.5)

## 2016-03-29 LAB — URINALYSIS, ROUTINE W REFLEX MICROSCOPIC
Bilirubin Urine: NEGATIVE
GLUCOSE, UA: NEGATIVE mg/dL
Ketones, ur: NEGATIVE mg/dL
Nitrite: POSITIVE — AB
PH: 6 (ref 5.0–8.0)
PROTEIN: 100 mg/dL — AB
Specific Gravity, Urine: 1.018 (ref 1.005–1.030)

## 2016-03-29 LAB — URINALYSIS, MICROSCOPIC (REFLEX)

## 2016-03-29 LAB — BASIC METABOLIC PANEL
Anion gap: 9 (ref 5–15)
BUN: 12 mg/dL (ref 6–20)
CHLORIDE: 98 mmol/L — AB (ref 101–111)
CO2: 25 mmol/L (ref 22–32)
CREATININE: 0.82 mg/dL (ref 0.44–1.00)
Calcium: 9 mg/dL (ref 8.9–10.3)
GFR calc Af Amer: >60 mL/min (ref >60)
GFR calc non Af Amer: >60 mL/min (ref >60)
Glucose, Bld: 138 mg/dL — ABNORMAL HIGH (ref 65–99)
Potassium: 3.6 mmol/L (ref 3.5–5.1)
Sodium: 132 mmol/L — ABNORMAL LOW (ref 135–145)

## 2016-03-29 LAB — I-STAT BETA HCG BLOOD, ED (MC, WL, AP ONLY): I-stat hCG, quantitative: <5 m[IU]/mL (ref <5)

## 2016-03-29 MED ORDER — DEXTROSE 5 % IV SOLN
1.0000 g | Freq: Once | INTRAVENOUS | Status: AC
  Administered 2016-03-29: 1 g via INTRAVENOUS
  Filled 2016-03-29: qty 10

## 2016-03-29 MED ORDER — CIPROFLOXACIN HCL 500 MG PO TABS: 500.0000 mg | ORAL_TABLET | Freq: Two times a day (BID) | ORAL | 0 refills | Status: DC

## 2016-03-29 MED ORDER — ACETAMINOPHEN 325 MG PO TABS
650.0000 mg | ORAL_TABLET | Freq: Once | ORAL | Status: AC
  Administered 2016-03-29: 650 mg via ORAL
  Filled 2016-03-29: qty 2

## 2016-03-29 MED ORDER — SODIUM CHLORIDE 0.9 % IV BOLUS (SEPSIS)
1000.0000 mL | Freq: Once | INTRAVENOUS | Status: AC
  Administered 2016-03-29: 1000 mL via INTRAVENOUS

## 2016-03-29 MED ORDER — HYDROCODONE-ACETAMINOPHEN 5-325 MG PO TABS: 1.0000 | ORAL_TABLET | ORAL | 0 refills | Status: DC | PRN

## 2016-03-29 MED ORDER — ONDANSETRON HCL 4 MG/2ML IJ SOLN
4.0000 mg | Freq: Once | INTRAMUSCULAR | Status: AC
  Administered 2016-03-29: 4 mg via INTRAVENOUS
  Filled 2016-03-29: qty 2

## 2016-03-29 MED ORDER — ONDANSETRON 4 MG PO TBDP: 4.0000 mg | ORAL_TABLET | Freq: Three times a day (TID) | ORAL | 0 refills | Status: DC | PRN

## 2016-03-29 MED ORDER — MORPHINE SULFATE (PF) 4 MG/ML IV SOLN
4.0000 mg | INTRAVENOUS | Status: DC | PRN
  Administered 2016-03-29: 4 mg via INTRAVENOUS
  Filled 2016-03-29: qty 1

## 2016-03-29 NOTE — ED Notes (Signed)
Bed: RU04WA05 Expected date:  Expected time:  Means of arrival:  Comments: Flank pain

## 2016-03-29 NOTE — ED Provider Notes (Signed)
[facility] Provider Note   CSN: [id number] Arrival date & time: 03/29/16  4098     History   Chief Complaint Chief Complaint  Patient presents with  . Flank Pain    HPI Alison Sanchez is a 50 y.o. female. She presents for evaluation of flank pain. Symptoms intermittently for the last 48 hours. More severe this morning. Felt fevers and shakes and chills yesterday. Had some dysuria this morning and frequency. This is new today. No history of urinary tract infections are viral. Nausea and vomiting. One loose stool. No dark urine no hematuria.  HPI  No past medical history on file.  There are no active problems to display for this patient.   Past Surgical History:  Procedure Laterality Date  . ECTOPIC PREGNANCY SURGERY     X 7 tubes were ruptured  . ectopics     pt. has had 6 ectopic pregnancies    OB History    No data available       Home Medications    Prior to Admission medications   Medication Sig Start Date End Date Taking? Authorizing Provider  famotidine (PEPCID) 20 MG tablet Take 20 mg by mouth 2 (two) times daily.   Yes Historical Provider, MD  ibuprofen (ADVIL,MOTRIN) 200 MG tablet Take 400 mg by mouth every 6 (six) hours as needed.   Yes Historical Provider, MD  ciprofloxacin (CIPRO) 500 MG tablet Take 1 tablet (500 mg total) by mouth every 12 (twelve) hours. 03/29/16   Rolland Porter, MD  HYDROcodone-acetaminophen (NORCO/VICODIN) 5-325 MG tablet Take 1 tablet by mouth every 4 (four) hours as needed. 03/29/16   Rolland Porter, MD  omeprazole (PRILOSEC) 20 MG capsule Take 1 capsule (20 mg total) by mouth 2 (two) times daily before a meal. Patient not taking: Reported on 03/29/2016 12/20/15   Elvina Sidle, MD  ondansetron (ZOFRAN ODT) 4 MG disintegrating tablet Take 1 tablet (4 mg total) by mouth every 8 (eight) hours as needed for nausea. 03/29/16   Rolland Porter, MD  varenicline (CHANTIX) 0.5 MG tablet Take 1 tablet (0.5 mg total) by mouth 2 (two) times  daily. Patient not taking: Reported on 03/29/2016 12/20/15   Elvina Sidle, MD    Family History No family history on file.  Social History Social History  Substance Use Topics  . Smoking status: Current Every Day Smoker    Packs/day: 1.00    Types: Cigarettes  . Smokeless tobacco: Never Used  . Alcohol use Yes     Comment: weekends     Allergies   Patient has no known allergies.   Review of Systems Review of Systems  Constitutional: Positive for chills and fever. Negative for appetite change, diaphoresis and fatigue.  HENT: Negative for mouth sores, sore throat and trouble swallowing.   Eyes: Negative for visual disturbance.  Respiratory: Negative for cough, chest tightness, shortness of breath and wheezing.   Cardiovascular: Negative for chest pain.  Gastrointestinal: Negative for abdominal distention, abdominal pain, diarrhea, nausea and vomiting.  Endocrine: Negative for polydipsia, polyphagia and polyuria.  Genitourinary: Positive for dysuria, flank pain and frequency. Negative for hematuria.  Musculoskeletal: Negative for gait problem.  Skin: Negative for color change, pallor and rash.  Neurological: Negative for dizziness, syncope, light-headedness and headaches.  Hematological: Does not bruise/bleed easily.  Psychiatric/Behavioral: Negative for behavioral problems and confusion.     Physical Exam Updated Vital Signs BP 100/67   Pulse 80   Temp 98.7 F (37.1 C) (Oral)   Resp 20  Ht 5\' 5"  (1.651 m)   Wt 177 lb (80.3 kg)   SpO2 92%   BMI 29.45 kg/m   Physical Exam  Constitutional: She is oriented to person, place, and time. She appears well-developed and well-nourished. No distress.  50 year old female. Appears uncomfortable.  HENT:  Head: Normocephalic.  Eyes: Conjunctivae are normal. Pupils are equal, round, and reactive to light. No scleral icterus.  Neck: Normal range of motion. Neck supple. No thyromegaly present.  Cardiovascular: Normal rate  and regular rhythm.  Exam reveals no gallop and no friction rub.   No murmur heard. Pulmonary/Chest: Effort normal and breath sounds normal. No respiratory distress. She has no wheezes. She has no rales.  Abdominal: Soft. Bowel sounds are normal. She exhibits no distension. There is no tenderness. There is no rebound.  Genitourinary:  Genitourinary Comments: Tenderness in the left flank. Anterior abdomen soft and benign.  Musculoskeletal: Normal range of motion.  Neurological: She is alert and oriented to person, place, and time.  Skin: Skin is warm and dry. No rash noted.  Psychiatric: She has a normal mood and affect. Her behavior is normal.     ED Treatments / Results  Labs (all labs ordered are listed, but only abnormal results are displayed) Labs Reviewed  URINALYSIS, ROUTINE W REFLEX MICROSCOPIC - Abnormal; Notable for the following:       Result Value   Color, Urine AMBER (*)    APPearance CLOUDY (*)    Hgb urine dipstick LARGE (*)    Protein, ur 100 (*)    Nitrite POSITIVE (*)    Leukocytes, UA LARGE (*)    All other components within normal limits  BASIC METABOLIC PANEL - Abnormal; Notable for the following:    Sodium 132 (*)    Chloride 98 (*)    Glucose, Bld 138 (*)    All other components within normal limits  CBC - Abnormal; Notable for the following:    WBC 12.1 (*)    All other components within normal limits  URINALYSIS, MICROSCOPIC (REFLEX) - Abnormal; Notable for the following:    Bacteria, UA MANY (*)    Squamous Epithelial / LPF 6-30 (*)    All other components within normal limits  I-STAT BETA HCG BLOOD, ED ([facility], [facility], [facility] ONLY)    EKG  EKG Interpretation None       Radiology No results found.  Procedures Procedures (including critical care time)  Medications Ordered in ED Medications  morphine 4 MG/ML injection 4 mg (4 mg Intravenous Given 03/29/16 0756)  cefTRIAXone (ROCEPHIN) 1 g in dextrose 5 % 50 mL IVPB (not administered)  ondansetron  (ZOFRAN) injection 4 mg (4 mg Intravenous Given 03/29/16 0756)  sodium chloride 0.9 % bolus 1,000 mL (1,000 mLs Intravenous New Bag/Given 03/29/16 0755)  acetaminophen (TYLENOL) tablet 650 mg (650 mg Oral Given 03/29/16 0756)     Initial Impression / Assessment and Plan / ED Course  I have reviewed the triage vital signs and the nursing notes.  Pertinent labs & imaging results that were available during my care of the patient were reviewed by me and considered in my medical decision making (see chart for details).  Clinical Course     Recheck oral temp by me is 100.9. Urine appears infected. Doubt stone. Patient does have hematuria. Given IV fluids, antiemetics, pain medication and her symptoms improved. She did have somewhat of a dysphoric reaction to morphine states her other symptoms were improving. Urine culture is pending. Given IV  Rocephin. Will be stable for outpatient treatment. Plan pushing fluids to stay hydrated. Prescriptions Vicodin, Cipro, Zofran. Primary care follow-up if not improving. ER with worsening.  Final Clinical Impressions(s) / ED Diagnoses   Final diagnoses:  Pyelonephritis    New Prescriptions New Prescriptions   CIPROFLOXACIN (CIPRO) 500 MG TABLET    Take 1 tablet (500 mg total) by mouth every 12 (twelve) hours.   HYDROCODONE-ACETAMINOPHEN (NORCO/VICODIN) 5-325 MG TABLET    Take 1 tablet by mouth every 4 (four) hours as needed.   ONDANSETRON (ZOFRAN ODT) 4 MG DISINTEGRATING TABLET    Take 1 tablet (4 mg total) by mouth every 8 (eight) hours as needed for nausea.     Rolland PorterMark Angelisa Winthrop, MD 03/29/16 [phone]

## 2016-03-29 NOTE — ED Notes (Signed)
Pt c/o flank pain on right side. Frequent urination, no dysuria reported. Stated symptoms are similar to a previous kidney infection.

## 2017-02-17 ENCOUNTER — Emergency Department (HOSPITAL_COMMUNITY): Payer: Self-pay

## 2017-02-17 ENCOUNTER — Emergency Department (HOSPITAL_COMMUNITY)
Admission: EM | Admit: 2017-02-17 | Discharge: 2017-02-17 | Disposition: A | Discharge status: Home / Self Care | Payer: Self-pay | Attending: Physician Assistant | Admitting: Physician Assistant

## 2017-02-17 ENCOUNTER — Encounter (HOSPITAL_COMMUNITY): Payer: Self-pay | Admitting: Emergency Medicine

## 2017-02-17 DIAGNOSIS — K2100 Gastro-esophageal reflux disease with esophagitis, without bleeding: Secondary | ICD-10-CM

## 2017-02-17 DIAGNOSIS — F17210 Nicotine dependence, cigarettes, uncomplicated: Secondary | ICD-10-CM | POA: Insufficient documentation

## 2017-02-17 DIAGNOSIS — R112 Nausea with vomiting, unspecified: Secondary | ICD-10-CM | POA: Insufficient documentation

## 2017-02-17 DIAGNOSIS — R1013 Epigastric pain: Secondary | ICD-10-CM | POA: Insufficient documentation

## 2017-02-17 DIAGNOSIS — Z79899 Other long term (current) drug therapy: Secondary | ICD-10-CM | POA: Insufficient documentation

## 2017-02-17 DIAGNOSIS — R072 Precordial pain: Secondary | ICD-10-CM

## 2017-02-17 LAB — CBC WITH DIFFERENTIAL/PLATELET
BASOS ABS: 0 10*3/uL (ref 0.0–0.1)
BASOS PCT: 0 %
Eosinophils Absolute: 0.1 10*3/uL (ref 0.0–0.7)
Eosinophils Relative: 1 %
HEMATOCRIT: 41.9 % (ref 36.0–46.0)
HEMOGLOBIN: 14 g/dL (ref 12.0–15.0)
Lymphocytes Relative: 43 %
Lymphs Abs: 4.2 10*3/uL — ABNORMAL HIGH (ref 0.7–4.0)
MCH: 27.7 pg (ref 26.0–34.0)
MCHC: 33.4 g/dL (ref 30.0–36.0)
MCV: 82.8 fL (ref 78.0–100.0)
Monocytes Absolute: 0.6 10*3/uL (ref 0.1–1.0)
Monocytes Relative: 6 %
NEUTROS ABS: 5.1 10*3/uL (ref 1.7–7.7)
NEUTROS PCT: 50 %
Platelets: 226 10*3/uL (ref 150–400)
RBC: 5.06 MIL/uL (ref 3.87–5.11)
RDW: 14 % (ref 11.5–15.5)
WBC: 9.9 10*3/uL (ref 4.0–10.5)

## 2017-02-17 LAB — COMPREHENSIVE METABOLIC PANEL
ALBUMIN: 4.1 g/dL (ref 3.5–5.0)
ALT: 11 U/L — ABNORMAL LOW (ref 14–54)
ANION GAP: 9 (ref 5–15)
AST: 24 U/L (ref 15–41)
Alkaline Phosphatase: 83 U/L (ref 38–126)
BILIRUBIN TOTAL: 0.4 mg/dL (ref 0.3–1.2)
BUN: 11 mg/dL (ref 6–20)
CO2: 27 mmol/L (ref 22–32)
Calcium: 10 mg/dL (ref 8.9–10.3)
Chloride: 100 mmol/L — ABNORMAL LOW (ref 101–111)
Creatinine, Ser: 0.96 mg/dL (ref 0.44–1.00)
GFR calc Af Amer: >60 mL/min (ref >60)
GLUCOSE: 100 mg/dL — AB (ref 65–99)
POTASSIUM: 3.9 mmol/L (ref 3.5–5.1)
Sodium: 136 mmol/L (ref 135–145)
TOTAL PROTEIN: 8.4 g/dL — AB (ref 6.5–8.1)

## 2017-02-17 LAB — LIPASE, BLOOD: LIPASE: 27 U/L (ref 11–51)

## 2017-02-17 LAB — I-STAT TROPONIN, ED: Troponin i, poc: 0 ng/mL (ref 0.00–0.08)

## 2017-02-17 MED ORDER — GI COCKTAIL ~~LOC~~
30.0000 mL | Freq: Once | ORAL | Status: AC
  Administered 2017-02-17: 30 mL via ORAL
  Filled 2017-02-17: qty 30

## 2017-02-17 MED ORDER — OMEPRAZOLE 20 MG PO CPDR: 20.0000 mg | DELAYED_RELEASE_CAPSULE | Freq: Every day | ORAL | 0 refills | Status: DC

## 2017-02-17 MED ORDER — SODIUM CHLORIDE 0.9 % IV BOLUS (SEPSIS)
1000.0000 mL | Freq: Once | INTRAVENOUS | Status: AC
  Administered 2017-02-17: 1000 mL via INTRAVENOUS

## 2017-02-17 MED ORDER — ONDANSETRON HCL 4 MG/2ML IJ SOLN
4.0000 mg | Freq: Once | INTRAMUSCULAR | Status: AC
  Administered 2017-02-17: 4 mg via INTRAVENOUS
  Filled 2017-02-17: qty 2

## 2017-02-17 MED ORDER — FAMOTIDINE IN NACL 20-0.9 MG/50ML-% IV SOLN
20.0000 mg | Freq: Once | INTRAVENOUS | Status: AC
  Administered 2017-02-17: 20 mg via INTRAVENOUS
  Filled 2017-02-17: qty 50

## 2017-02-17 NOTE — ED Triage Notes (Signed)
Pt here with c/o epigastric pain that radiates up through her chest , pt has been out of her meds

## 2017-02-17 NOTE — ED Provider Notes (Signed)
[facility] Provider [facility]   CSN: [id number] Arrival date & time: 02/17/17  1839     History   Chief Complaint Chief Complaint  Patient presents with  . Chest Pain  . Gastroesophageal Reflux    HPI Alison Sanchez is a 51 y.o. female.  Alison Sanchez is a 51 y.o. Female who presents to the emergency department complaining of epigastric pain, lower chest pain, nausea, and vomiting for the past three days.  She reports this feels like her acid reflux and gastritis again.  She tells me she has been out of her omeprazole for the past week.  She reports pain that begins in her epigastric area and radiates up into the lower portion of her chest.  She reports associated burping and belching.  She is having nausea and has vomited several times.  She took ranitidine about 3 days ago without relief of her symptoms.  She reports her reflux is worse when laying flat in the bed.  She denies trouble breathing.  Only previous abdominal surgical history is after previous ectopic pregnancy.  She drinks alcohol occasionally and reports she has not drank any alcohol in the last week.  She denies fevers, hematemesis, urinary symptoms, lower abdominal pain, shortness of breath, wheezing, lightheadedness, dizziness or rashes.   The history is provided by the patient and medical records. No language interpreter was used.  Chest Pain   Associated symptoms include abdominal pain, nausea and vomiting. Pertinent negatives include no back pain, no cough, no fever, no headaches, no palpitations and no shortness of breath.  Gastroesophageal Reflux  Associated symptoms include chest pain and abdominal pain. Pertinent negatives include no headaches and no shortness of breath.    History reviewed. No pertinent past medical history.  There are no active problems to display for this patient.   Past Surgical History:  Procedure Laterality Date  . ECTOPIC PREGNANCY SURGERY     X 7  tubes were ruptured  . ectopics     pt. has had 6 ectopic pregnancies    OB History    No data available       Home Medications    Prior to Admission medications   Medication Sig Start Date End Date Taking? Authorizing Provider  Ginger, Zingiber officinalis, (GINGER PO) Take by mouth. Drank ginger   Yes [provider]  OVER THE COUNTER MEDICATION Garlic water   Yes [provider]  ciprofloxacin (CIPRO) 500 MG tablet Take 1 tablet (500 mg total) by mouth every 12 (twelve) hours. Patient not taking: Reported on 02/17/2017 03/29/16   Rolland PorterJames, Mark, MD  HYDROcodone-acetaminophen (NORCO/VICODIN) 5-325 MG tablet Take 1 tablet by mouth every 4 (four) hours as needed. Patient not taking: Reported on 02/17/2017 03/29/16   Rolland PorterJames, Mark, MD  omeprazole (PRILOSEC) 20 MG capsule Take 1 capsule (20 mg total) daily by mouth. 02/17/17   Everlene Farrieransie, Shequilla Goodgame, PA-C  ondansetron (ZOFRAN ODT) 4 MG disintegrating tablet Take 1 tablet (4 mg total) by mouth every 8 (eight) hours as needed for nausea. Patient not taking: Reported on 02/17/2017 03/29/16   Rolland PorterJames, Mark, MD  varenicline (CHANTIX) 0.5 MG tablet Take 1 tablet (0.5 mg total) by mouth 2 (two) times daily. Patient not taking: Reported on 03/29/2016 12/20/15   Elvina SidleLauenstein, Kurt, MD    Family History History reviewed. No pertinent family history.  Social History Social History   Tobacco Use  . Smoking status: Current Every Day Smoker    Packs/day: 1.00  Types: Cigarettes  . Smokeless tobacco: Never Used  Substance Use Topics  . Alcohol use: Yes    Comment: weekends  . Drug use: Yes    Types: Marijuana    Comment: marijuana use once a week     Allergies   Patient has no known allergies.   Review of Systems Review of Systems  Constitutional: Negative for chills and fever.  HENT: Negative for congestion and sore throat.   Eyes: Negative for visual disturbance.  Respiratory: Negative for cough and shortness of breath.     Cardiovascular: Positive for chest pain. Negative for palpitations and leg swelling.  Gastrointestinal: Positive for abdominal pain, nausea and vomiting. Negative for blood in stool and diarrhea.  Genitourinary: Negative for difficulty urinating, dysuria, frequency, urgency, vaginal bleeding and vaginal discharge.  Musculoskeletal: Negative for back pain and neck pain.  Skin: Negative for rash.  Neurological: Negative for headaches.     Physical Exam Updated Vital Signs BP 122/73   Pulse 61   Temp 98.4 F (36.9 C) (Oral)   Resp 18   SpO2 100%   Physical Exam  Constitutional: She appears well-developed and well-nourished.  Non-toxic appearance. She does not appear ill. No distress.  HENT:  Head: Normocephalic and atraumatic.  Mouth/Throat: Oropharynx is clear and moist.  Eyes: Conjunctivae are normal. Pupils are equal, round, and reactive to light. Right eye exhibits no discharge. Left eye exhibits no discharge.  Neck: Neck supple. No JVD present.  Cardiovascular: Normal rate, regular rhythm, normal heart sounds and intact distal pulses. Exam reveals no gallop and no friction rub.  No murmur heard. Pulmonary/Chest: Effort normal and breath sounds normal. No respiratory distress. She has no decreased breath sounds. She has no wheezes. She has no rales.  Abdominal: Soft. Bowel sounds are normal. She exhibits no distension, no ascites and no mass. There is tenderness.  Abdomen is soft.  Bowel sounds are present.  Patient has epigastric abdominal tenderness to palpation.  Musculoskeletal: She exhibits no edema.       Right lower leg: She exhibits no tenderness and no edema.       Left lower leg: She exhibits no tenderness and no edema.  Lymphadenopathy:    She has no cervical adenopathy.  Neurological: She is alert. Coordination normal.  Skin: Skin is warm and dry. Capillary refill takes less than 2 seconds. No rash noted. She is not diaphoretic. No erythema. No pallor.   Psychiatric: She has a normal mood and affect. Her behavior is normal.  Nursing note and vitals reviewed.    ED Treatments / Results  Labs (all labs ordered are listed, but only abnormal results are displayed) Labs Reviewed  COMPREHENSIVE METABOLIC PANEL - Abnormal; Notable for the following components:      Result Value   Chloride 100 (*)    Glucose, Bld 100 (*)    Total Protein 8.4 (*)    ALT 11 (*)    All other components within normal limits  CBC WITH DIFFERENTIAL/PLATELET - Abnormal; Notable for the following components:   Lymphs Abs 4.2 (*)    All other components within normal limits  LIPASE, BLOOD  I-STAT TROPONIN, ED    EKG  EKG Interpretation  Date/Time:  Wednesday February 17 2017 18:57:40 EST Ventricular Rate:  74 PR Interval:  208 QRS Duration: 82 QT Interval:  350 QTC Calculation: 388 R Axis:   73 Text Interpretation:  Normal sinus rhythm Normal ECG Normal sinus rhythm Confirmed by Mackuen, Courteney ([id number]) on  02/17/2017 10:45:13 PM       Radiology Dg Chest 2 View  Result Date: 02/17/2017 CLINICAL DATA:  Acute chest pain for 3 days. EXAM: CHEST  2 VIEW COMPARISON:  11/11/2012 and prior studies FINDINGS: The cardiomediastinal silhouette is unremarkable. There is no evidence of focal airspace disease, pulmonary edema, suspicious pulmonary nodule/mass, pleural effusion, or pneumothorax. No acute bony abnormalities are identified. IMPRESSION: No active cardiopulmonary disease. Electronically Signed   By: Harmon Pier M.D.   On: 02/17/2017 20:53    Procedures Procedures (including critical care time)  Medications Ordered in ED Medications  sodium chloride 0.9 % bolus 1,000 mL (0 mLs Intravenous Stopped 02/17/17 2219)  ondansetron (ZOFRAN) injection 4 mg (4 mg Intravenous Given 02/17/17 2055)  famotidine (PEPCID) IVPB 20 mg premix (0 mg Intravenous Stopped 02/17/17 2218)  gi cocktail (Maalox,Lidocaine,Donnatal) (30 mLs Oral Given 02/17/17 2055)     Initial  Impression / Assessment and Plan / ED Course  I have reviewed the triage vital signs and the nursing notes.  Pertinent labs & imaging results that were available during my care of the patient were reviewed by me and considered in my medical decision making (see chart for details).    This is a 51 y.o. Female who presents to the emergency department complaining of epigastric pain, lower chest pain, nausea, and vomiting for the past three days.  She reports this feels like her acid reflux and gastritis again.  She tells me she has been out of her omeprazole for the past week.  She reports pain that begins in her epigastric area and radiates up into the lower portion of her chest.  She reports associated burping and belching.  She is having nausea and has vomited several times.  She took ranitidine about 3 days ago without relief of her symptoms.  She reports her reflux is worse when laying flat in the bed.  She denies trouble breathing.  On exam the patient is afebrile nontoxic-appearing.  Her abdomen is soft and she has epigastric tenderness to palpation.  Lungs are clear to auscultation.  EKG shows normal sinus rhythm. Troponin is not elevated.  Low suspicion for ACS in this patient.  CMP is unremarkable.  Normal liver enzymes.  Lipase is within normal bases unremarkable.  No leukocytosis.  Chest x-ray is unremarkable. After receiving fluid bolus, GI cocktail and Pepcid patient tells me all of her symptoms have completely resolved.  She is tolerating p.o. prior to discharge.  She has no further abdominal tenderness to palpation.  Suspect gastritis and GERD. She is out of her omeprazole.  We will restart her on omeprazole.  I discussed food options that will help with her reflux.  Return precautions discussed. I advised the patient to follow-up with their primary care provider this week. I advised the patient to return to the emergency department with new or worsening symptoms or new concerns. The patient  verbalized understanding and agreement with plan.      Final Clinical Impressions(s) / ED Diagnoses   Final diagnoses:  Gastroesophageal reflux disease with esophagitis  Precordial pain    ED Discharge Orders        Ordered    omeprazole (PRILOSEC) 20 MG capsule  Daily     02/17/17 2312       Everlene Farrier, PA-C 02/17/17 2340    Mackuen, Cindee Salt, MD 02/18/17 0000

## 2017-07-09 ENCOUNTER — Ambulatory Visit (HOSPITAL_COMMUNITY)
Admission: EM | Admit: 2017-07-09 | Discharge: 2017-07-09 | Disposition: A | Discharge status: Home / Self Care | Payer: Medicaid Other | Attending: Family Medicine | Admitting: Family Medicine

## 2017-07-09 ENCOUNTER — Encounter (HOSPITAL_COMMUNITY): Payer: Self-pay | Admitting: Family Medicine

## 2017-07-09 DIAGNOSIS — M25511 Pain in right shoulder: Secondary | ICD-10-CM

## 2017-07-09 MED ORDER — DICLOFENAC SODIUM 75 MG PO TBEC
75.0000 mg | DELAYED_RELEASE_TABLET | Freq: Two times a day (BID) | ORAL | 0 refills | Status: DC
Start: 2017-07-09 — End: 2017-12-23

## 2017-07-09 NOTE — ED Provider Notes (Signed)
[facility]    CSN: [id number] Arrival date & time: 07/09/17  1357     History   Chief Complaint Chief Complaint  Patient presents with  . Shoulder Pain    HPI Alison Sanchez is a 52 y.o. female.   Presents with a 3 day history of right shoulder pain. No known injury, but she does do repetitive motion at work (flipping hamburgers). Her pain is worse with abduction and lying on the right side. No neck pain. No radicular pain and no weakness. No prior history of shoulder pain.      No past medical history on file.  There are no active problems to display for this patient.   Past Surgical History:  Procedure Laterality Date  . ECTOPIC PREGNANCY SURGERY     X 7 tubes were ruptured  . ectopics     pt. has had 6 ectopic pregnancies    OB History   None      Home Medications    Prior to Admission medications   Medication Sig Start Date End Date Taking? Authorizing Provider  ciprofloxacin (CIPRO) 500 MG tablet Take 1 tablet (500 mg total) by mouth every 12 (twelve) hours. Patient not taking: Reported on 02/17/2017 03/29/16   Rolland Porter, MD  Ginger, Zingiber officinalis, (GINGER PO) Take by mouth. Drank ginger    [provider]  HYDROcodone-acetaminophen (NORCO/VICODIN) 5-325 MG tablet Take 1 tablet by mouth every 4 (four) hours as needed. Patient not taking: Reported on 02/17/2017 03/29/16   Rolland Porter, MD  omeprazole (PRILOSEC) 20 MG capsule Take 1 capsule (20 mg total) daily by mouth. 02/17/17   Everlene Farrier, PA-C  ondansetron (ZOFRAN ODT) 4 MG disintegrating tablet Take 1 tablet (4 mg total) by mouth every 8 (eight) hours as needed for nausea. Patient not taking: Reported on 02/17/2017 03/29/16   Rolland Porter, MD  OVER THE COUNTER MEDICATION Garlic water    [provider]  varenicline (CHANTIX) 0.5 MG tablet Take 1 tablet (0.5 mg total) by mouth 2 (two) times daily. Patient not taking: Reported on 03/29/2016 12/20/15   Elvina Sidle,  MD    Family History No family history on file.  Social History Social History   Tobacco Use  . Smoking status: Current Every Day Smoker    Packs/day: 1.00    Types: Cigarettes  . Smokeless tobacco: Never Used  Substance Use Topics  . Alcohol use: Yes    Comment: weekends  . Drug use: Yes    Types: Marijuana    Comment: marijuana use once a week     Allergies   Patient has no known allergies.   Review of Systems Review of Systems   Physical Exam Triage Vital Signs ED Triage Vitals  Enc Vitals Group     BP      Pulse      Resp      Temp      Temp src      SpO2      Weight      Height      Head Circumference      Peak Flow      Pain Score      Pain Loc      Pain Edu?      Excl. in GC?    No data found.  Updated Vital Signs There were no vitals taken for this visit.  Visual Acuity Right Eye Distance:   Left Eye Distance:   Bilateral Distance:  Right Eye Near:   Left Eye Near:    Bilateral Near:     Physical Exam  Constitutional: She is oriented to person, place, and time. She appears well-developed and well-nourished. No distress.  Musculoskeletal: Normal range of motion.  No swelling or deformity of the right shoulder, painful ABD and IR, +impingement sign, no frank weakness  Neurological: She is alert and oriented to person, place, and time. She exhibits normal muscle tone.  Skin: Skin is warm and dry. She is not diaphoretic.  Psychiatric: Her behavior is normal.  Nursing note and vitals reviewed.    UC Treatments / Results  Labs (all labs ordered are listed, but only abnormal results are displayed) Labs Reviewed - No data to display  EKG None Radiology No results found.  Procedures Procedures (including critical care time)  Medications Ordered in UC Medications - No data to display   Initial Impression / Assessment and Plan / UC Course  I have reviewed the triage vital signs and the nursing notes.  Pertinent labs &  imaging results that were available during my care of the patient were reviewed by me and considered in my medical decision making (see chart for details).     Probable RTC tendonitis. Treat conservatively with NSAIDs, ice and rest. Work note given. If continues f/u with Orthopedics.   Final Clinical Impressions(s) / UC Diagnoses   Final diagnoses:  None    ED Discharge Orders    None       Controlled Substance Prescriptions [place] Controlled Substance Registry consulted? Not Applicable   Sharin MonsYoung, Nixxon Faria G, PA-C 07/09/17 1445

## 2017-07-09 NOTE — Discharge Instructions (Addendum)
I think this is a tendonitis of the shoulder. Take the medication twice daily with food, use ice and rest. If this worsens f/u with the Orthopedic listed. Hope you feel better.

## 2017-07-09 NOTE — ED Triage Notes (Signed)
Pt here for 2 days of right shoulder pain. sts that she woke up this way. Denies injury. Right middle and ring finger are numb.

## 2017-12-02 ENCOUNTER — Emergency Department (HOSPITAL_COMMUNITY): Payer: Self-pay

## 2017-12-02 ENCOUNTER — Other Ambulatory Visit: Payer: Self-pay

## 2017-12-02 ENCOUNTER — Emergency Department (HOSPITAL_COMMUNITY)
Admission: EM | Admit: 2017-12-02 | Discharge: 2017-12-02 | Disposition: A | Discharge status: Home / Self Care | Payer: Self-pay | Attending: Emergency Medicine | Admitting: Emergency Medicine

## 2017-12-02 DIAGNOSIS — Z7984 Long term (current) use of oral hypoglycemic drugs: Secondary | ICD-10-CM | POA: Insufficient documentation

## 2017-12-02 DIAGNOSIS — K219 Gastro-esophageal reflux disease without esophagitis: Secondary | ICD-10-CM | POA: Insufficient documentation

## 2017-12-02 DIAGNOSIS — F17210 Nicotine dependence, cigarettes, uncomplicated: Secondary | ICD-10-CM | POA: Insufficient documentation

## 2017-12-02 DIAGNOSIS — R0789 Other chest pain: Secondary | ICD-10-CM | POA: Insufficient documentation

## 2017-12-02 LAB — CBC WITH DIFFERENTIAL/PLATELET
Abs Immature Granulocytes: 0 10*3/uL (ref 0.0–0.1)
Basophils Absolute: 0 10*3/uL (ref 0.0–0.1)
Basophils Relative: 0 %
EOS PCT: 1 %
Eosinophils Absolute: 0.1 10*3/uL (ref 0.0–0.7)
HEMATOCRIT: 37.9 % (ref 36.0–46.0)
Hemoglobin: 12 g/dL (ref 12.0–15.0)
IMMATURE GRANULOCYTES: 0 %
LYMPHS ABS: 3.4 10*3/uL (ref 0.7–4.0)
LYMPHS PCT: 44 %
MCH: 26.6 pg (ref 26.0–34.0)
MCHC: 31.7 g/dL (ref 30.0–36.0)
MCV: 84 fL (ref 78.0–100.0)
Monocytes Absolute: 0.6 10*3/uL (ref 0.1–1.0)
Monocytes Relative: 8 %
Neutro Abs: 3.6 10*3/uL (ref 1.7–7.7)
Neutrophils Relative %: 47 %
Platelets: 202 10*3/uL (ref 150–400)
RBC: 4.51 MIL/uL (ref 3.87–5.11)
RDW: 13.2 % (ref 11.5–15.5)
WBC: 7.7 10*3/uL (ref 4.0–10.5)

## 2017-12-02 LAB — LIPASE, BLOOD: LIPASE: 30 U/L (ref 11–51)

## 2017-12-02 LAB — I-STAT TROPONIN, ED: Troponin i, poc: 0.01 ng/mL (ref 0.00–0.08)

## 2017-12-02 LAB — I-STAT CHEM 8, ED
BUN: 9 mg/dL (ref 6–20)
CREATININE: 0.7 mg/dL (ref 0.44–1.00)
Calcium, Ion: 1.18 mmol/L (ref 1.15–1.40)
Chloride: 102 mmol/L (ref 98–111)
Glucose, Bld: 100 mg/dL — ABNORMAL HIGH (ref 70–99)
HEMATOCRIT: 38 % (ref 36.0–46.0)
Hemoglobin: 12.9 g/dL (ref 12.0–15.0)
Potassium: 3.5 mmol/L (ref 3.5–5.1)
Sodium: 140 mmol/L (ref 135–145)
TCO2: 26 mmol/L (ref 22–32)

## 2017-12-02 LAB — HEPATIC FUNCTION PANEL
ALT: 10 U/L (ref 0–44)
AST: 21 U/L (ref 15–41)
Albumin: 3.5 g/dL (ref 3.5–5.0)
Alkaline Phosphatase: 75 U/L (ref 38–126)
Bilirubin, Direct: 0.2 mg/dL (ref 0.0–0.2)
Indirect Bilirubin: 1 mg/dL — ABNORMAL HIGH (ref 0.3–0.9)
TOTAL PROTEIN: 7.5 g/dL (ref 6.5–8.1)
Total Bilirubin: 1.2 mg/dL (ref 0.3–1.2)

## 2017-12-02 MED ORDER — DICYCLOMINE HCL 10 MG PO CAPS
20.0000 mg | ORAL_CAPSULE | Freq: Once | ORAL | Status: AC
  Administered 2017-12-02: 20 mg via ORAL
  Filled 2017-12-02: qty 2

## 2017-12-02 MED ORDER — GI COCKTAIL ~~LOC~~
30.0000 mL | Freq: Once | ORAL | Status: AC
  Administered 2017-12-02: 30 mL via ORAL
  Filled 2017-12-02: qty 30

## 2017-12-02 MED ORDER — DICYCLOMINE HCL 10 MG PO CAPS: 10.0000 mg | ORAL_CAPSULE | Freq: Once | ORAL | Status: DC

## 2017-12-02 MED ORDER — OMEPRAZOLE 20 MG PO CPDR: 20.0000 mg | DELAYED_RELEASE_CAPSULE | Freq: Two times a day (BID) | ORAL | 0 refills | Status: DC

## 2017-12-02 MED ORDER — SODIUM CHLORIDE 0.9 % IV BOLUS
500.0000 mL | Freq: Once | INTRAVENOUS | Status: AC
  Administered 2017-12-02: 500 mL via INTRAVENOUS

## 2017-12-02 MED ORDER — LIDOCAINE VISCOUS HCL 2 % MT SOLN: 15.0000 mL | OROMUCOSAL | 0 refills | Status: DC | PRN

## 2017-12-02 MED ORDER — DICYCLOMINE HCL 20 MG PO TABS: 20.0000 mg | ORAL_TABLET | Freq: Two times a day (BID) | ORAL | 0 refills | Status: DC

## 2017-12-02 MED ORDER — FAMOTIDINE IN NACL 20-0.9 MG/50ML-% IV SOLN
20.0000 mg | Freq: Once | INTRAVENOUS | Status: AC
  Administered 2017-12-02: 20 mg via INTRAVENOUS
  Filled 2017-12-02: qty 50

## 2017-12-02 MED ORDER — ALUM & MAG HYDROXIDE-SIMETH 400-400-40 MG/5ML PO SUSP: 15.0000 mL | Freq: Four times a day (QID) | ORAL | 0 refills | Status: DC | PRN

## 2017-12-02 NOTE — ED Notes (Signed)
Pt oob to bathroom with steady gait,. 

## 2017-12-02 NOTE — ED Notes (Signed)
Patient transported to X-ray 

## 2017-12-02 NOTE — ED Notes (Signed)
PO challenge  while drinking water pt states she is having pain.

## 2017-12-02 NOTE — Discharge Instructions (Addendum)
Evaluation today is reassuring I think the pain is related to your acid reflux, and labs EKG and chest is clear you do not suggest an a problem with your heart or lungs causing her symptoms today.  Please avoid acidic foods like coffee.  Begin taking omeprazole twice daily, you may use Maalox and viscous lidocaine for breakthrough pain, as well as Bentyl to help with esophageal spasm.  Follow-up with your primary care doctor as well as GI for continued evaluation of your reflux.  Pain worsens, you are unable to keep down food or fluids, your pain becomes worse with exertion or breathing, you develop fever or any other new or concerning symptoms return to the emergency department for reevaluation.

## 2017-12-02 NOTE — ED Triage Notes (Signed)
Pt started having cp yesterday afternoon, sts feels like indigestion and can taste acid in mouth. Has taken Tums and Zantac with no relief. 4Mg  Zofran and 6 MG morphine given PTA. Pain to 1/10.

## 2017-12-02 NOTE — ED Notes (Signed)
Pt given ice water pt states swallowing

## 2017-12-02 NOTE — ED Notes (Signed)
Pt stats she understands instructions. Home stable with steady gait after declines wc.

## 2017-12-02 NOTE — ED Provider Notes (Signed)
[facility] EMERGENCY DEPARTMENT Provider Note   CSN: [id number] Arrival date & time: 12/02/17  0539     History   Chief Complaint Chief Complaint  Patient presents with  . Chest Pain    HPI Alison Sanchez is a 52 y.o. female.  Alison Sanchez is a 52 y.o. Female with a history of ectopic pregnancy, GERD and gastritis, who presents to the emergency department for evaluation of chest pain that started yesterday afternoon.  She reports it feels like her indigestion, and she continues to acid in her mouth.  She took Tums and Zantac without relief.  Was given for Zofran and 6 of morphine with EMS prior to arrival improved pain to 1/10.  Patient reports she has a long history of acid reflux and gastritis, and is not supposed to drink coffee but had a cup yesterday afternoon at work prior to her symptoms beginning.  Since then it is been painful to swallow and she has burning discomfort in her chest.  No associated shortness of breath, lightheadedness or syncope.  Pain is not worse with exertion.  No radiation to the arm neck or jaw.  She has been slightly nauseated but has not had any vomiting.  No diaphoresis.  No abdominal pain.  She denies lower extremity pain or swelling, no PE or DVT history, no hormone therapy, no recent surgeries or long distance travel, no cough or hemoptysis.  The history is provided by the patient.  Chest Pain   This is a recurrent problem. The current episode started 6 to 12 hours ago. The problem occurs constantly. The problem has been gradually improving. The pain is associated with eating. The pain is present in the epigastric region and substernal region. The pain is moderate. The quality of the pain is described as burning. The pain radiates to the epigastrium. Associated symptoms include nausea. Pertinent negatives include no back pain, no cough, no dizziness, no fever, no headaches, no shortness of breath and no vomiting.  Pertinent negatives for  past medical history include no CAD, no DVT, no hyperlipidemia, no hypertension and no PE.    No past medical history on file.  There are no active problems to display for this patient.   Past Surgical History:  Procedure Laterality Date  . ECTOPIC PREGNANCY SURGERY     X 7 tubes were ruptured  . ectopics     pt. has had 6 ectopic pregnancies     OB History   None      Home Medications    Prior to Admission medications   Medication Sig Start Date End Date Taking? Authorizing Provider  diclofenac (VOLTAREN) 75 MG EC tablet Take 1 tablet (75 mg total) by mouth 2 (two) times daily. 07/09/17   Riki Sheer, PA-C  Ginger, Zingiber officinalis, (GINGER PO) Take by mouth. Drank ginger    [provider]  OVER THE COUNTER MEDICATION Garlic water    [provider]  ranitidine (ZANTAC) 150 MG capsule Take 150 mg by mouth 2 (two) times daily.    [provider]    Family History No family history on file.  Social History Social History   Tobacco Use  . Smoking status: Current Every Day Smoker    Packs/day: 1.00    Types: Cigarettes  . Smokeless tobacco: Never Used  Substance Use Topics  . Alcohol use: Yes    Comment: weekends  . Drug use: Yes    Types: Marijuana    Comment:  marijuana use once a week     Allergies   Patient has no known allergies.   Review of Systems Review of Systems  Constitutional: Negative for chills and fever.  HENT: Negative.   Eyes: Negative for visual disturbance.  Respiratory: Negative for cough and shortness of breath.   Cardiovascular: Positive for chest pain.  Gastrointestinal: Positive for abdominal distention and nausea. Negative for blood in stool, constipation, diarrhea and vomiting.  Genitourinary: Negative for dysuria and frequency.  Musculoskeletal: Negative for arthralgias, back pain and neck pain.  Skin: Negative for color change and rash.  Neurological: Negative for dizziness, syncope,  light-headedness and headaches.     Physical Exam Updated Vital Signs BP 108/89 (BP Location: Right Arm)   Pulse 60   Temp 97.9 F (36.6 C) (Oral)   Resp 16   Ht 5\' 5"  (1.651 m)   Wt 81.2 kg   SpO2 99%   BMI 29.79 kg/m   Physical Exam  Constitutional: She is oriented to person, place, and time. She appears well-developed and well-nourished. No distress.  HENT:  Head: Normocephalic and atraumatic.  Mouth/Throat: Oropharynx is clear and moist.  Eyes: Pupils are equal, round, and reactive to light. EOM are normal. Right eye exhibits no discharge. Left eye exhibits no discharge.  Neck: Neck supple.  Cardiovascular: Normal rate, regular rhythm, normal heart sounds and intact distal pulses.  Pulses:      Radial pulses are 2+ on the right side, and 2+ on the left side.       Dorsalis pedis pulses are 2+ on the right side, and 2+ on the left side.       Posterior tibial pulses are 2+ on the right side, and 2+ on the left side.  Pulmonary/Chest: Effort normal and breath sounds normal. No respiratory distress. She has no wheezes. She has no rales.  Respirations equal and unlabored, patient able to speak in full sentences, lungs clear to auscultation bilaterally  Abdominal: Soft. Bowel sounds are normal. She exhibits no distension and no mass. There is no tenderness. There is no guarding.  Abdomen soft, nondistended, nontender to palpation in all quadrants without guarding or peritoneal signs  Musculoskeletal: She exhibits no edema or deformity.       Right lower leg: Normal. She exhibits no tenderness and no edema.       Left lower leg: Normal. She exhibits no tenderness and no edema.  Neurological: She is alert and oriented to person, place, and time. Coordination normal.  Speech is clear, able to follow commands CN III-XII intact Normal strength in upper and lower extremities bilaterally including dorsiflexion and plantar flexion, strong and equal grip strength Sensation normal to  light and sharp touch Moves extremities without ataxia, coordination intact  Skin: Skin is warm and dry. Capillary refill takes less than 2 seconds. She is not diaphoretic.  Nursing note and vitals reviewed.    ED Treatments / Results  Labs (all labs ordered are listed, but only abnormal results are displayed) Labs Reviewed  HEPATIC FUNCTION PANEL - Abnormal; Notable for the following components:      Result Value   Indirect Bilirubin 1.0 (*)    All other components within normal limits  I-STAT CHEM 8, ED - Abnormal; Notable for the following components:   Glucose, Bld 100 (*)    All other components within normal limits  CBC WITH DIFFERENTIAL/PLATELET  LIPASE, BLOOD  I-STAT TROPONIN, ED    EKG EKG Interpretation  Date/Time:  Thursday December 02 2017 05:43:53 EDT Ventricular Rate:  64 PR Interval:    QRS Duration: 95 QT Interval:  390 QTC Calculation: 403 R Axis:   82 Text Interpretation:  Sinus rhythm Prolonged PR interval Confirmed by Palumbo, April ([id number]) on 12/02/2017 5:52:10 AM   Radiology Dg Chest 2 View  Result Date: 12/02/2017 CLINICAL DATA:  Chest pain with swallowing. EXAM: CHEST - 2 VIEW COMPARISON:  02/17/2017. FINDINGS: Mediastinum and hilar structures normal. Heart size stable. No focal infiltrate. No pleural effusion or pneumothorax. Thoracolumbar spine scoliosis. IMPRESSION: No acute cardiopulmonary disease. Electronically Signed   By: Maisie Fushomas  Register   On: 12/02/2017 06:28    Procedures Procedures (including critical care time)  Medications Ordered in ED Medications  dicyclomine (BENTYL) capsule 20 mg (has no administration in time range)  gi cocktail (Maalox,Lidocaine,Donnatal) (30 mLs Oral Given 12/02/17 0631)  famotidine (PEPCID) IVPB 20 mg premix (0 mg Intravenous Stopped 12/02/17 0721)     Initial Impression / Assessment and Plan / ED Course  I have reviewed the triage vital signs and the nursing notes.  Pertinent labs & imaging results that  were available during my care of the patient were reviewed by me and considered in my medical decision making (see chart for details).  Patient presents with chest pain since yesterday afternoon after drinking coffee, as long history of GERD and gastritis and reports she typically does not drink coffee because it worsens the symptoms.  Chest pain is not likely of cardiac or pulmonary etiology d/t presentation, PERC negative, VSS, no tracheal deviation, no JVD or new murmur, RRR, breath sounds equal bilaterally, EKG without acute abnormalities, negative troponin, and negative CXR.  Pain improved after GI cocktail, Bentyl and Pepcid.  Patient tolerating p.o. fluids here in the emergency department without vomiting, have a low suspicion for food bolus.  Reports she still has mild swallowing.  Patient is to be discharged with recommendation to follow up with PCP in regards to today's hospital visit.  Will start patient on PPI, prescribed antispasmodics, Maalox and viscous lidocaine as well to help manage symptoms.  Pt has been advised to return to the ED if CP becomes exertional, associated with diaphoresis or nausea, radiates to left jaw/arm, worsens or becomes concerning in any way. Pt appears reliable for follow up and is agreeable to discharge.   Blood pressure 112/62, pulse (!) 50, temperature 97.9 F (36.6 C), temperature source Oral, resp. rate 14, height 5\' 5"  (1.651 m), weight 81.2 kg, SpO2 99 %.  Case has been discussed with  Dr. Nicanor AlconPalumbo who agrees with the above plan to discharge.   Final Clinical Impressions(s) / ED Diagnoses   Final diagnoses:  Atypical chest pain  Gastroesophageal reflux disease, esophagitis presence not specified    ED Discharge Orders         Ordered    omeprazole (PRILOSEC) 20 MG capsule  2 times daily before meals     12/02/17 0717    lidocaine (XYLOCAINE) 2 % solution  As needed     12/02/17 0717    alum & mag hydroxide-simeth (MAALOX ADVANCED MAX ST) 400-400-40  MG/5ML suspension  Every 6 hours PRN     12/02/17 0717    dicyclomine (BENTYL) 20 MG tablet  2 times daily     12/02/17 0731           Dartha LodgeFord, Amayiah Gosnell N, PA-C 12/02/17 0830    Palumbo, April, MD 12/03/17 [phone]

## 2017-12-23 ENCOUNTER — Ambulatory Visit (INDEPENDENT_AMBULATORY_CARE_PROVIDER_SITE_OTHER): Payer: Self-pay | Admitting: Physician Assistant

## 2017-12-23 ENCOUNTER — Encounter (INDEPENDENT_AMBULATORY_CARE_PROVIDER_SITE_OTHER): Payer: Self-pay | Admitting: Physician Assistant

## 2017-12-23 ENCOUNTER — Other Ambulatory Visit: Payer: Self-pay

## 2017-12-23 VITALS — BP 110/74 | HR 82 | Temp 98.1°F | Ht 65.0 in | Wt 167.8 lb

## 2017-12-23 DIAGNOSIS — Z124 Encounter for screening for malignant neoplasm of cervix: Secondary | ICD-10-CM

## 2017-12-23 DIAGNOSIS — Z1159 Encounter for screening for other viral diseases: Secondary | ICD-10-CM

## 2017-12-23 DIAGNOSIS — Z1239 Encounter for other screening for malignant neoplasm of breast: Secondary | ICD-10-CM

## 2017-12-23 DIAGNOSIS — K219 Gastro-esophageal reflux disease without esophagitis: Secondary | ICD-10-CM

## 2017-12-23 DIAGNOSIS — Z1231 Encounter for screening mammogram for malignant neoplasm of breast: Secondary | ICD-10-CM

## 2017-12-23 DIAGNOSIS — Z23 Encounter for immunization: Secondary | ICD-10-CM

## 2017-12-23 DIAGNOSIS — Z1211 Encounter for screening for malignant neoplasm of colon: Secondary | ICD-10-CM

## 2017-12-23 DIAGNOSIS — Z114 Encounter for screening for human immunodeficiency virus [HIV]: Secondary | ICD-10-CM

## 2017-12-23 DIAGNOSIS — Z021 Encounter for pre-employment examination: Secondary | ICD-10-CM

## 2017-12-23 MED ORDER — OMEPRAZOLE 40 MG PO CPDR: 40.0000 mg | DELAYED_RELEASE_CAPSULE | Freq: Every day | ORAL | 3 refills | Status: DC

## 2017-12-23 NOTE — Patient Instructions (Signed)
Food Choices for Gastroesophageal Reflux Disease, Adult When you have gastroesophageal reflux disease (GERD), the foods you eat and your eating habits are very important. Choosing the right foods can help ease your discomfort. What guidelines do I need to follow?  Choose fruits, vegetables, whole grains, and low-fat dairy products.  Choose low-fat meat, fish, and poultry.  Limit fats such as oils, salad dressings, butter, nuts, and avocado.  Keep a food diary. This helps you identify foods that cause symptoms.  Avoid foods that cause symptoms. These may be different for everyone.  Eat small meals often instead of 3 large meals a day.  Eat your meals slowly, in a place where you are relaxed.  Limit fried foods.  Cook foods using methods other than frying.  Avoid drinking alcohol.  Avoid drinking large amounts of liquids with your meals.  Avoid bending over or lying down until 2-3 hours after eating. What foods are not recommended? These are some foods and drinks that may make your symptoms worse: Vegetables  Tomatoes. Tomato juice. Tomato and spaghetti sauce. Chili peppers. Onion and garlic. Horseradish. Fruits  Oranges, grapefruit, and lemon (fruit and juice). Meats  High-fat meats, fish, and poultry. This includes hot dogs, ribs, ham, sausage, salami, and bacon. Dairy  Whole milk and chocolate milk. Sour cream. Cream. Butter. Ice cream. Cream cheese. Drinks  Coffee and tea. Bubbly (carbonated) drinks or energy drinks. Condiments  Hot sauce. Barbecue sauce. Sweets/Desserts  Chocolate and cocoa. Donuts. Peppermint and spearmint. Fats and Oils  High-fat foods. This includes French fries and potato chips. Other  Vinegar. Strong spices. This includes black pepper, white pepper, red pepper, cayenne, curry powder, cloves, ginger, and chili powder. The items listed above may not be a complete list of foods and drinks to avoid. Contact your dietitian for more information.    This information is not intended to replace advice given to you by your health care provider. Make sure you discuss any questions you have with your health care provider. Document Released: 09/29/2011 Document Revised: 09/05/2015 Document Reviewed: 02/01/2013 Elsevier Interactive Patient Education  2017 Elsevier Inc.  

## 2017-12-23 NOTE — Progress Notes (Signed)
Subjective:  Patient ID: Alison Sanchez, female    DOB: [birth year]  Age: 52 y.o. MRN: [id number]  CC: physical    HPI Liberta Gimpel is a 52 y.o. female with a medical history of GERD, Pyelonephritis, dental pain, tendonitis of left shoulder presents as a new patient for a pre-employment physical. States she has never had communicable disease. No longer endorses shoulder pain. Feeling generally well except for occasional episodes of gastritis with acid reflux and occasional headache. Does not endorse hematochezia, melena,  hematemesis, abdominal bloating, f/c/n/v, CP, palpitations, SOB, rash, swelling, or GI/GU sxs.        Outpatient Medications Prior to Visit  Medication Sig Dispense Refill  . alum & mag hydroxide-simeth (MAALOX ADVANCED MAX ST) 400-400-40 MG/5ML suspension Take 15 mLs by mouth every 6 (six) hours as needed for indigestion. 355 mL 0  . ranitidine (ZANTAC) 150 MG capsule Take 150 mg by mouth 2 (two) times daily.    Marland Kitchen lidocaine (XYLOCAINE) 2 % solution Use as directed 15 mLs in the mouth or throat as needed for mouth pain. 100 mL 0  . omeprazole (PRILOSEC) 20 MG capsule Take 1 capsule (20 mg total) by mouth 2 (two) times daily before a meal. 60 capsule 0  . diclofenac (VOLTAREN) 75 MG EC tablet Take 1 tablet (75 mg total) by mouth 2 (two) times daily. (Patient not taking: Reported on 12/02/2017) 30 tablet 0  . dicyclomine (BENTYL) 20 MG tablet Take 1 tablet (20 mg total) by mouth 2 (two) times daily. 20 tablet 0   No facility-administered medications prior to visit.      ROS Review of Systems  Constitutional: Negative for chills, fever and malaise/fatigue.  Eyes: Negative for blurred vision.  Respiratory: Negative for shortness of breath.   Cardiovascular: Negative for chest pain and palpitations.  Gastrointestinal: Negative for abdominal pain and nausea.  Genitourinary: Negative for dysuria and hematuria.  Musculoskeletal: Negative for joint pain and myalgias.   Skin: Negative for rash.  Neurological: Negative for tingling and headaches.  Psychiatric/Behavioral: Negative for depression. The patient is not nervous/anxious.     Objective:  BP 110/74 (BP Location: Left Arm, Patient Position: Sitting, Cuff Size: Normal)   Pulse 82   Temp 98.1 F (36.7 C) (Oral)   Ht 5\' 5"  (1.651 m)   Wt 167 lb 12.8 oz (76.1 kg)   SpO2 96%   BMI 27.92 kg/m   BP/Weight 12/23/2017 12/02/2017 07/09/2017  Systolic BP 110 98 105  Diastolic BP 74 60 71  Wt. (Lbs) 167.8 179 -  BMI 27.92 29.79 -      Physical Exam  Constitutional: She is oriented to person, place, and time.  Well developed, well nourished, NAD, polite  HENT:  Head: Normocephalic and atraumatic.  Eyes: Pupils are equal, round, and reactive to light. Conjunctivae and EOM are normal. No scleral icterus.  Neck: Normal range of motion. Neck supple. No thyromegaly present.  Cardiovascular: Normal rate, regular rhythm and normal heart sounds.  Pulmonary/Chest: Effort normal and breath sounds normal. No respiratory distress.  Abdominal: Soft. Bowel sounds are normal. There is no tenderness.  Musculoskeletal: Normal range of motion. She exhibits no edema.  Neurological: She is alert and oriented to person, place, and time. She displays normal reflexes. No cranial nerve deficit or sensory deficit. She exhibits normal muscle tone. Coordination normal.  Strength 5/5 throughout  Skin: Skin is warm and dry. No rash noted. No erythema. No pallor.  Psychiatric: She has a normal mood and  affect. Her behavior is normal. Thought content normal.  Vitals reviewed.    Assessment & Plan:    1. Physical exam, pre-employment - CBC with Differential - Comprehensive metabolic panel - Lipid panel; Future - PPD; Future. To be placed on Monday.   2. Screening for HIV (human immunodeficiency virus) - HIV antibody  3. Need for hepatitis C screening test - Hepatitis panel, acute  4. Need for Tdap vaccination -  Tdap vaccine greater than or equal to 7yo IM  5. Need for prophylactic vaccination and inoculation against influenza - Flu Vaccine QUAD 6+ mos PF IM (Fluarix Quad PF)  6. Screening for breast cancer - MM DIGITAL SCREENING BILATERAL; Future  7. Screening for colon cancer - Fecal occult blood, imunochemical  8. Screening for cervical cancer - Pt to make appointment with [facility]  9. Gastroesophageal reflux disease, esophagitis presence not specified - omeprazole (PRILOSEC) 40 MG capsule; Take 1 capsule (40 mg total) by mouth daily.  Dispense: 30 capsule; Refill: 3   Meds ordered this encounter  Medications  . omeprazole (PRILOSEC) 40 MG capsule    Sig: Take 1 capsule (40 mg total) by mouth daily.    Dispense:  30 capsule    Refill:  3    Order Specific Question:   Supervising Provider    Answer:   Hoy RegisterNEWLIN, ENOBONG [[id number]]    Follow-up: Return if symptoms worsen or fail to improve.   Loletta Specteroger David Gomez PA

## 2017-12-25 LAB — HEPATITIS PANEL, ACUTE
HEP B C IGM: NEGATIVE
Hep A IgM: NEGATIVE
Hep C Virus Ab: 0.1 s/co ratio (ref 0.0–0.9)
Hepatitis B Surface Ag: POSITIVE — AB

## 2017-12-25 LAB — CBC WITH DIFFERENTIAL/PLATELET
BASOS ABS: 0 10*3/uL (ref 0.0–0.2)
Basos: 0 %
EOS (ABSOLUTE): 0.1 10*3/uL (ref 0.0–0.4)
Eos: 2 %
HEMOGLOBIN: 13.2 g/dL (ref 11.1–15.9)
Hematocrit: 40.7 % (ref 34.0–46.6)
Immature Grans (Abs): 0 10*3/uL (ref 0.0–0.1)
Immature Granulocytes: 0 %
LYMPHS ABS: 3.9 10*3/uL — AB (ref 0.7–3.1)
Lymphs: 49 %
MCH: 26.5 pg — ABNORMAL LOW (ref 26.6–33.0)
MCHC: 32.4 g/dL (ref 31.5–35.7)
MCV: 82 fL (ref 79–97)
MONOCYTES: 5 %
Monocytes Absolute: 0.4 10*3/uL (ref 0.1–0.9)
Neutrophils Absolute: 3.5 10*3/uL (ref 1.4–7.0)
Neutrophils: 44 %
PLATELETS: 237 10*3/uL (ref 150–450)
RBC: 4.98 x10E6/uL (ref 3.77–5.28)
RDW: 13 % (ref 12.3–15.4)
WBC: 8.1 10*3/uL (ref 3.4–10.8)

## 2017-12-25 LAB — COMPREHENSIVE METABOLIC PANEL
ALBUMIN: 4.1 g/dL (ref 3.5–5.5)
ALT: 10 IU/L (ref 0–32)
AST: 20 IU/L (ref 0–40)
Albumin/Globulin Ratio: 1 — ABNORMAL LOW (ref 1.2–2.2)
Alkaline Phosphatase: 92 IU/L (ref 39–117)
BILIRUBIN TOTAL: 0.4 mg/dL (ref 0.0–1.2)
BUN/Creatinine Ratio: 12 (ref 9–23)
BUN: 10 mg/dL (ref 6–24)
CHLORIDE: 98 mmol/L (ref 96–106)
CO2: 27 mmol/L (ref 20–29)
CREATININE: 0.86 mg/dL (ref 0.57–1.00)
Calcium: 9.9 mg/dL (ref 8.7–10.2)
GFR calc Af Amer: 90 mL/min/{1.73_m2} (ref >59)
GFR calc non Af Amer: 78 mL/min/{1.73_m2} (ref >59)
GLUCOSE: 86 mg/dL (ref 65–99)
Globulin, Total: 4.3 g/dL (ref 1.5–4.5)
Potassium: 4.1 mmol/L (ref 3.5–5.2)
Sodium: 140 mmol/L (ref 134–144)
Total Protein: 8.4 g/dL (ref 6.0–8.5)

## 2017-12-25 LAB — HIV ANTIBODY (ROUTINE TESTING W REFLEX): HIV SCREEN 4TH GENERATION: NONREACTIVE

## 2017-12-27 ENCOUNTER — Other Ambulatory Visit (INDEPENDENT_AMBULATORY_CARE_PROVIDER_SITE_OTHER): Payer: Self-pay | Admitting: Physician Assistant

## 2017-12-27 ENCOUNTER — Ambulatory Visit (INDEPENDENT_AMBULATORY_CARE_PROVIDER_SITE_OTHER): Payer: Self-pay

## 2017-12-27 ENCOUNTER — Telehealth (INDEPENDENT_AMBULATORY_CARE_PROVIDER_SITE_OTHER): Payer: Self-pay

## 2017-12-27 DIAGNOSIS — R768 Other specified abnormal immunological findings in serum: Secondary | ICD-10-CM

## 2017-12-27 DIAGNOSIS — Z021 Encounter for pre-employment examination: Secondary | ICD-10-CM

## 2017-12-27 DIAGNOSIS — Z111 Encounter for screening for respiratory tuberculosis: Secondary | ICD-10-CM

## 2017-12-27 DIAGNOSIS — Z1211 Encounter for screening for malignant neoplasm of colon: Secondary | ICD-10-CM

## 2017-12-27 NOTE — Progress Notes (Signed)
Labs collected by onsite [facility] phlebotomist

## 2017-12-27 NOTE — Telephone Encounter (Signed)
-----   Message from Loletta Specteroger David Gomez, PA-C sent at 12/27/2017  8:51 AM EDT ----- Hepatitis B Antigen positive. I have ordered testing to verify chronic infection vs active infection

## 2017-12-27 NOTE — Telephone Encounter (Signed)
Left voicemail asking patient to return call to the office. Maryjean Mornempestt S Damyan Corne, CMA

## 2017-12-28 ENCOUNTER — Telehealth (INDEPENDENT_AMBULATORY_CARE_PROVIDER_SITE_OTHER): Payer: Self-pay

## 2017-12-28 ENCOUNTER — Other Ambulatory Visit (INDEPENDENT_AMBULATORY_CARE_PROVIDER_SITE_OTHER): Payer: Self-pay | Admitting: Physician Assistant

## 2017-12-28 DIAGNOSIS — B181 Chronic viral hepatitis B without delta-agent: Secondary | ICD-10-CM

## 2017-12-28 LAB — HEPATITIS B SURFACE ANTIBODY, QUANTITATIVE: Hepatitis B Surf Ab Quant: 5.3 m[IU]/mL — ABNORMAL LOW (ref >9.9)

## 2017-12-28 LAB — HEPATITIS B CORE ANTIBODY, TOTAL: HEP B C TOTAL AB: POSITIVE — AB

## 2017-12-28 NOTE — Telephone Encounter (Signed)
Left voicemail asking patient to return call to [facility] at [phone]. Maryjean Mornempestt S Roberts, CMA

## 2017-12-28 NOTE — Telephone Encounter (Signed)
-----   Message from Loletta Specteroger David Gomez, PA-C sent at 12/28/2017  1:06 PM EDT ----- Seems patient likely has chronic hepatitis B infection. I am referring to Infectious Disease.

## 2017-12-28 NOTE — Telephone Encounter (Signed)
-----   Message from Roger David Gomez, PA-C sent at 12/28/2017  1:06 PM EDT ----- Seems patient likely has chronic hepatitis B infection. I am referring to Infectious Disease. 

## 2017-12-28 NOTE — Telephone Encounter (Signed)
Patient returned call and was notified that Hep B infection is chronic and referral has been placed to Infectious Disease. Explained to patient that someone from ID will call and get her scheduled. Maryjean Mornempestt S Michon Kaczmarek, CMA

## 2017-12-29 ENCOUNTER — Telehealth (INDEPENDENT_AMBULATORY_CARE_PROVIDER_SITE_OTHER): Payer: Self-pay

## 2017-12-29 LAB — FECAL OCCULT BLOOD, IMMUNOCHEMICAL: Fecal Occult Bld: NEGATIVE

## 2017-12-29 NOTE — Telephone Encounter (Signed)
-----   Message from Loletta Specteroger David Gomez, PA-C sent at 12/29/2017  9:30 AM EDT ----- FIT negative.

## 2017-12-29 NOTE — Telephone Encounter (Signed)
Patient is aware of negative FIT. Alison Sanchez, CMA  

## 2017-12-30 ENCOUNTER — Ambulatory Visit (INDEPENDENT_AMBULATORY_CARE_PROVIDER_SITE_OTHER): Payer: Medicaid Other

## 2017-12-30 LAB — TB SKIN TEST
Induration: 0 mm
TB Skin Test: NEGATIVE

## 2018-01-31 ENCOUNTER — Ambulatory Visit: Payer: Self-pay | Attending: Physician Assistant

## 2018-02-22 ENCOUNTER — Ambulatory Visit: Payer: Medicaid Other | Admitting: Nurse Practitioner

## 2019-01-15 ENCOUNTER — Other Ambulatory Visit: Payer: Self-pay

## 2019-01-15 DIAGNOSIS — F17210 Nicotine dependence, cigarettes, uncomplicated: Secondary | ICD-10-CM | POA: Insufficient documentation

## 2019-01-15 DIAGNOSIS — R0789 Other chest pain: Secondary | ICD-10-CM | POA: Insufficient documentation

## 2019-01-16 ENCOUNTER — Encounter (HOSPITAL_COMMUNITY): Payer: Self-pay | Admitting: Obstetrics and Gynecology

## 2019-01-16 ENCOUNTER — Emergency Department (HOSPITAL_COMMUNITY)
Admission: EM | Admit: 2019-01-16 | Discharge: 2019-01-16 | Disposition: A | Discharge status: Home / Self Care | Payer: Self-pay | Attending: Emergency Medicine | Admitting: Emergency Medicine

## 2019-01-16 ENCOUNTER — Emergency Department (HOSPITAL_COMMUNITY): Payer: Self-pay

## 2019-01-16 DIAGNOSIS — K219 Gastro-esophageal reflux disease without esophagitis: Secondary | ICD-10-CM

## 2019-01-16 HISTORY — DX: Gastro-esophageal reflux disease without esophagitis: K21.9

## 2019-01-16 HISTORY — DX: Gastritis, unspecified, without bleeding: K29.70

## 2019-01-16 LAB — I-STAT CHEM 8, ED
BUN: 12 mg/dL (ref 6–20)
Calcium, Ion: 1.17 mmol/L (ref 1.15–1.40)
Chloride: 99 mmol/L (ref 98–111)
Creatinine, Ser: 0.9 mg/dL (ref 0.44–1.00)
Glucose, Bld: 107 mg/dL — ABNORMAL HIGH (ref 70–99)
HCT: 46 % (ref 36.0–46.0)
Hemoglobin: 15.6 g/dL — ABNORMAL HIGH (ref 12.0–15.0)
Potassium: 3.7 mmol/L (ref 3.5–5.1)
Sodium: 140 mmol/L (ref 135–145)
TCO2: 30 mmol/L (ref 22–32)

## 2019-01-16 LAB — CBC WITH DIFFERENTIAL/PLATELET
Abs Immature Granulocytes: 0.01 10*3/uL (ref 0.00–0.07)
Basophils Absolute: 0 10*3/uL (ref 0.0–0.1)
Basophils Relative: 0 %
Eosinophils Absolute: 0.1 10*3/uL (ref 0.0–0.5)
Eosinophils Relative: 1 %
HCT: 45.9 % (ref 36.0–46.0)
Hemoglobin: 14.3 g/dL (ref 12.0–15.0)
Immature Granulocytes: 0 %
Lymphocytes Relative: 51 %
Lymphs Abs: 4.8 10*3/uL — ABNORMAL HIGH (ref 0.7–4.0)
MCH: 26.2 pg (ref 26.0–34.0)
MCHC: 31.2 g/dL (ref 30.0–36.0)
MCV: 84.1 fL (ref 80.0–100.0)
Monocytes Absolute: 0.5 10*3/uL (ref 0.1–1.0)
Monocytes Relative: 5 %
Neutro Abs: 4 10*3/uL (ref 1.7–7.7)
Neutrophils Relative %: 43 %
Platelets: 264 10*3/uL (ref 150–400)
RBC: 5.46 MIL/uL — ABNORMAL HIGH (ref 3.87–5.11)
RDW: 13.1 % (ref 11.5–15.5)
WBC: 9.4 10*3/uL (ref 4.0–10.5)
nRBC: 0 % (ref 0.0–0.2)

## 2019-01-16 LAB — TROPONIN I (HIGH SENSITIVITY): Troponin I (High Sensitivity): 4 ng/L (ref <18)

## 2019-01-16 MED ORDER — DICYCLOMINE HCL 10 MG PO CAPS
10.0000 mg | ORAL_CAPSULE | Freq: Once | ORAL | Status: AC
  Administered 2019-01-16: 10 mg via ORAL
  Filled 2019-01-16: qty 1

## 2019-01-16 MED ORDER — OMEPRAZOLE 20 MG PO CPDR: 20.0000 mg | DELAYED_RELEASE_CAPSULE | Freq: Every day | ORAL | 0 refills | Status: DC

## 2019-01-16 MED ORDER — ALUM & MAG HYDROXIDE-SIMETH 200-200-20 MG/5ML PO SUSP
30.0000 mL | Freq: Once | ORAL | Status: AC
  Administered 2019-01-16: 30 mL via ORAL
  Filled 2019-01-16: qty 30

## 2019-01-16 MED ORDER — LIDOCAINE VISCOUS HCL 2 % MT SOLN
15.0000 mL | Freq: Once | OROMUCOSAL | Status: AC
  Administered 2019-01-16: 15 mL via ORAL
  Filled 2019-01-16: qty 15

## 2019-01-16 MED ORDER — SODIUM CHLORIDE 0.9% FLUSH: 3.0000 mL | Freq: Once | INTRAVENOUS | Status: DC

## 2019-01-16 MED ORDER — SUCRALFATE 1 GM/10ML PO SUSP: 1.0000 g | Freq: Three times a day (TID) | ORAL | Status: DC

## 2019-01-16 MED FILL — OMEPRAZOLE 20 MG CAP: 20 | 30 days supply | Qty: 30 | Fill #0

## 2019-01-16 NOTE — ED Notes (Signed)
Pt transported to xray 

## 2019-01-16 NOTE — ED Triage Notes (Signed)
Patient reports to the ED with complaint with chest pain. Pt reports she has GERD and gastritis and has been taking mallox without pain reduction.  Patient reports central chest pain.

## 2019-01-16 NOTE — ED Provider Notes (Signed)
[facility] DEPT Provider Note   CSN: [id number] Arrival date & time: 01/15/19  2353     History   Chief Complaint Chief Complaint  Patient presents with  . Chest Pain    HPI Alison Sanchez is a 53 y.o. female.     The history is provided by the patient.  Chest Pain Pain location:  Epigastric and substernal area Pain quality: burning   Pain radiates to:  Does not radiate Pain severity:  Moderate Onset quality:  Gradual Duration:  3 days Timing:  Constant Progression:  Unchanged Chronicity:  Recurrent Context: at rest   Context: not breathing, not drug use, not eating, not intercourse, not lifting, not movement, not raising an arm, not stress and not trauma   Relieved by:  Nothing Worsened by:  Nothing Ineffective treatments:  None tried Associated symptoms: no AICD problem, no altered mental status, no anorexia, no anxiety, no back pain, no claudication, no cough, no diaphoresis, no dizziness, no dysphagia, no fatigue, no fever, no headache, no heartburn, no lower extremity edema, no nausea, no near-syncope, no numbness, no orthopnea, no palpitations, no PND, no shortness of breath, no syncope, no vomiting and no weakness   Risk factors: no aortic disease, not female, not obese, not pregnant, no prior DVT/PE and no surgery   Patient with a h/o GERD who presents with 3 days of ongoing symptoms.  No DOE.  No exertional chest pain.  No n/v/d.  No cough, no travel, no leg pain, no OCP.  No f/c/r.    Past Medical History:  Diagnosis Date  . Gastritis   . GERD (gastroesophageal reflux disease)     There are no active problems to display for this patient.   Past Surgical History:  Procedure Laterality Date  . ECTOPIC PREGNANCY SURGERY     X 7 tubes were ruptured  . ectopics     pt. has had 6 ectopic pregnancies     OB History   No obstetric history on file.      Home Medications    Prior to Admission medications   Medication Sig  Start Date End Date Taking? Authorizing Provider  omeprazole (PRILOSEC) 40 MG capsule Take 1 capsule (40 mg total) by mouth daily. 12/23/17   Clent Demark, PA-C    Family History No family history on file.  Social History Social History   Tobacco Use  . Smoking status: Current Every Day Smoker    Packs/day: 1.00    Types: Cigarettes  . Smokeless tobacco: Never Used  Substance Use Topics  . Alcohol use: Yes    Comment: weekends  . Drug use: Yes    Types: Marijuana    Comment: marijuana use once a week     Allergies   Patient has no known allergies.   Review of Systems Review of Systems  Constitutional: Negative for diaphoresis, fatigue and fever.  HENT: Negative for trouble swallowing.   Eyes: Negative for visual disturbance.  Respiratory: Negative for cough and shortness of breath.   Cardiovascular: Positive for chest pain. Negative for palpitations, orthopnea, claudication, leg swelling, syncope, PND and near-syncope.  Gastrointestinal: Negative for anorexia, heartburn, nausea and vomiting.  Genitourinary: Negative for difficulty urinating.  Musculoskeletal: Negative for back pain.  Skin: Negative for rash.  Neurological: Negative for dizziness, weakness, numbness and headaches.  Psychiatric/Behavioral: Negative for agitation.  All other systems reviewed and are negative.    Physical Exam Updated Vital Signs BP (!) 164/90 (BP Location: Left  Arm)   Pulse 69   Temp 98.1 F (36.7 C) (Oral)   Resp 18   SpO2 98%   Physical Exam Vitals signs and nursing note reviewed.  Constitutional:      General: She is not in acute distress. HENT:     Head: Normocephalic and atraumatic.     Nose: Nose normal.  Eyes:     Conjunctiva/sclera: Conjunctivae normal.     Pupils: Pupils are equal, round, and reactive to light.  Neck:     Musculoskeletal: Normal range of motion and neck supple.  Cardiovascular:     Rate and Rhythm: Normal rate and regular rhythm.      Pulses: Normal pulses.     Heart sounds: Normal heart sounds.  Pulmonary:     Effort: Pulmonary effort is normal.     Breath sounds: Normal breath sounds.  Abdominal:     General: Abdomen is flat.     Palpations: Abdomen is soft.     Tenderness: There is no abdominal tenderness. There is no guarding or rebound.     Comments: Gassy   Musculoskeletal: Normal range of motion.     Right lower leg: No edema.     Left lower leg: No edema.  Skin:    General: Skin is warm and dry.     Capillary Refill: Capillary refill takes less than 2 seconds.  Neurological:     General: No focal deficit present.     Mental Status: She is alert and oriented to person, place, and time.     Deep Tendon Reflexes: Reflexes normal.  Psychiatric:        Mood and Affect: Mood normal.        Behavior: Behavior normal.      ED Treatments / Results  Labs (all labs ordered are listed, but only abnormal results are displayed) Results for orders placed or performed during the hospital encounter of 01/16/19  CBC with Differential/Platelet  Result Value Ref Range   WBC 9.4 4.0 - 10.5 K/uL   RBC 5.46 (H) 3.87 - 5.11 MIL/uL   Hemoglobin 14.3 12.0 - 15.0 g/dL   HCT 45.8 59.2 - 92.4 %   MCV 84.1 80.0 - 100.0 fL   MCH 26.2 26.0 - 34.0 pg   MCHC 31.2 30.0 - 36.0 g/dL   RDW 46.2 86.3 - 81.7 %   Platelets 264 150 - 400 K/uL   nRBC 0.0 0.0 - 0.2 %   Neutrophils Relative % 43 %   Neutro Abs 4.0 1.7 - 7.7 K/uL   Lymphocytes Relative 51 %   Lymphs Abs 4.8 (H) 0.7 - 4.0 K/uL   Monocytes Relative 5 %   Monocytes Absolute 0.5 0.1 - 1.0 K/uL   Eosinophils Relative 1 %   Eosinophils Absolute 0.1 0.0 - 0.5 K/uL   Basophils Relative 0 %   Basophils Absolute 0.0 0.0 - 0.1 K/uL   Immature Granulocytes 0 %   Abs Immature Granulocytes 0.01 0.00 - 0.07 K/uL  I-stat chem 8, ED (not at [facility] or [facility])  Result Value Ref Range   Sodium 140 135 - 145 mmol/L   Potassium 3.7 3.5 - 5.1 mmol/L   Chloride 99 98 - 111 mmol/L   BUN  12 6 - 20 mg/dL   Creatinine, Ser 7.11 0.44 - 1.00 mg/dL   Glucose, Bld 657 (H) 70 - 99 mg/dL   Calcium, Ion 9.03 8.33 - 1.40 mmol/L   TCO2 30 22 - 32 mmol/L  Hemoglobin 15.6 (H) 12.0 - 15.0 g/dL   HCT 16.146.0 09.636.0 - 04.546.0 %   Dg Chest 2 View  Result Date: 01/16/2019 CLINICAL DATA:  Chest pain EXAM: CHEST - 2 VIEW COMPARISON:  12/02/2017 FINDINGS: The heart size and mediastinal contours are within normal limits. Both lungs are clear. The visualized skeletal structures are unremarkable. IMPRESSION: No active cardiopulmonary disease. Electronically Signed   By: Jasmine PangKim  Fujinaga M.D.   On: 01/16/2019 00:35    EKG EKG Interpretation  Date/Time:  Monday January 16 2019 00:35:50 EDT Ventricular Rate:  68 PR Interval:    QRS Duration: 111 QT Interval:  404 QTC Calculation: 430 R Axis:   75 Text Interpretation:  Sinus rhythm Confirmed by Shacarra Choe ([id number]) on 01/16/2019 12:58:28 AM   Radiology Dg Chest 2 View  Result Date: 01/16/2019 CLINICAL DATA:  Chest pain EXAM: CHEST - 2 VIEW COMPARISON:  12/02/2017 FINDINGS: The heart size and mediastinal contours are within normal limits. Both lungs are clear. The visualized skeletal structures are unremarkable. IMPRESSION: No active cardiopulmonary disease. Electronically Signed   By: Jasmine PangKim  Fujinaga M.D.   On: 01/16/2019 00:35    Procedures Procedures (including critical care time)  Medications Ordered in ED Medications  sodium chloride flush (NS) 0.9 % injection 3 mL (has no administration in time range)  sucralfate (CARAFATE) 1 GM/10ML suspension 1 g (has no administration in time range)  alum & mag hydroxide-simeth (MAALOX/MYLANTA) 200-200-20 MG/5ML suspension 30 mL (30 mLs Oral Given 01/16/19 0045)    And  lidocaine (XYLOCAINE) 2 % viscous mouth solution 15 mL (15 mLs Oral Given 01/16/19 0045)  dicyclomine (BENTYL) capsule 10 mg (10 mg Oral Given 01/16/19 0045)    Symptoms consistent with GERD in the setting of ongoing symptom one negative  troponin and EKG is sufficient to rule out ACS.  HEART score is 1 very low risk for MACE.   Alison Sanchez was evaluated in Emergency Department on 01/16/2019 for the symptoms described in the history of present illness. She was evaluated in the context of the global COVID-19 pandemic, which necessitated consideration that the patient might be at risk for infection with the SARS-CoV-2 virus that causes COVID-19. Institutional protocols and algorithms that pertain to the evaluation of patients at risk for COVID-19 are in a state of rapid change based on information released by regulatory bodies including the CDC and federal and state organizations. These policies and algorithms were followed during the patient's care in the ED.   Final Clinical Impressions(s) / ED Diagnoses   Return for intractable cough, coughing up blood,fevers >100.4 unrelieved by medication, shortness of breath, intractable vomiting, chest pain, shortness of breath, weakness,numbness, changes in speech, facial asymmetry,abdominal pain, passing out,Inability to tolerate liquids or food, cough, altered mental status or any concerns. No signs of systemic illness or infection. The patient is nontoxic-appearing on exam and vital signs are within normal limits.   I have reviewed the triage vital signs and the nursing notes. Pertinent labs &imaging results that were available during my care of the patient were reviewed by me and considered in my medical decision making (see chart for details).After history, exam, and medical workup I feel the patient has beenappropriately medically screened and is safe for discharge home. Pertinent diagnoses were discussed with the patient. Patient was given return precautions.   Kristl Morioka, MD 01/16/19 [id number]

## 2020-02-29 IMAGING — DX DG CHEST 2V
2 series · 2 of 2 positions shown · non-contrast
Comparison: 02/17/2017.

CLINICAL DATA: Chest pain with swallowing.

EXAM:
CHEST - 2 VIEW

[chest pa]
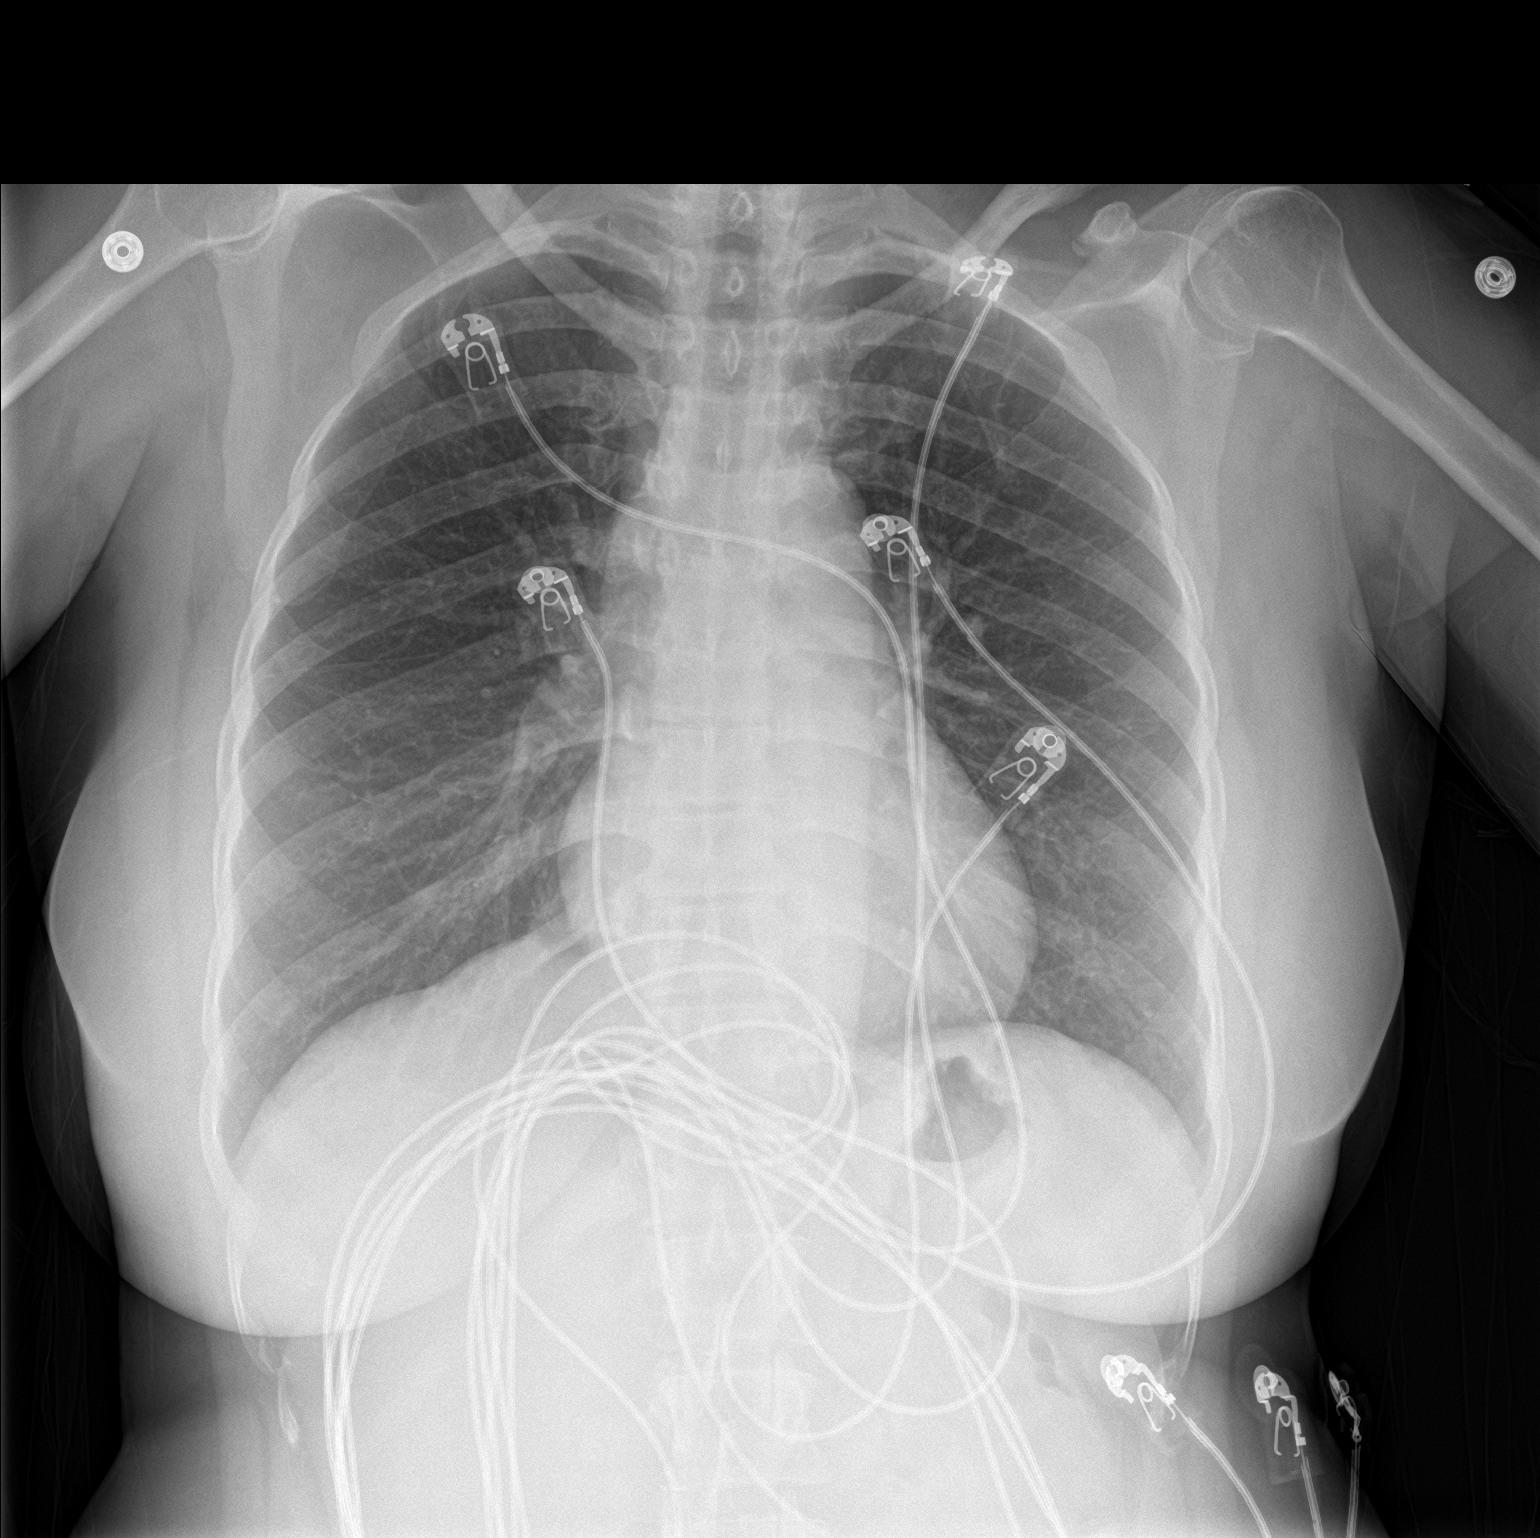

[chest lat]
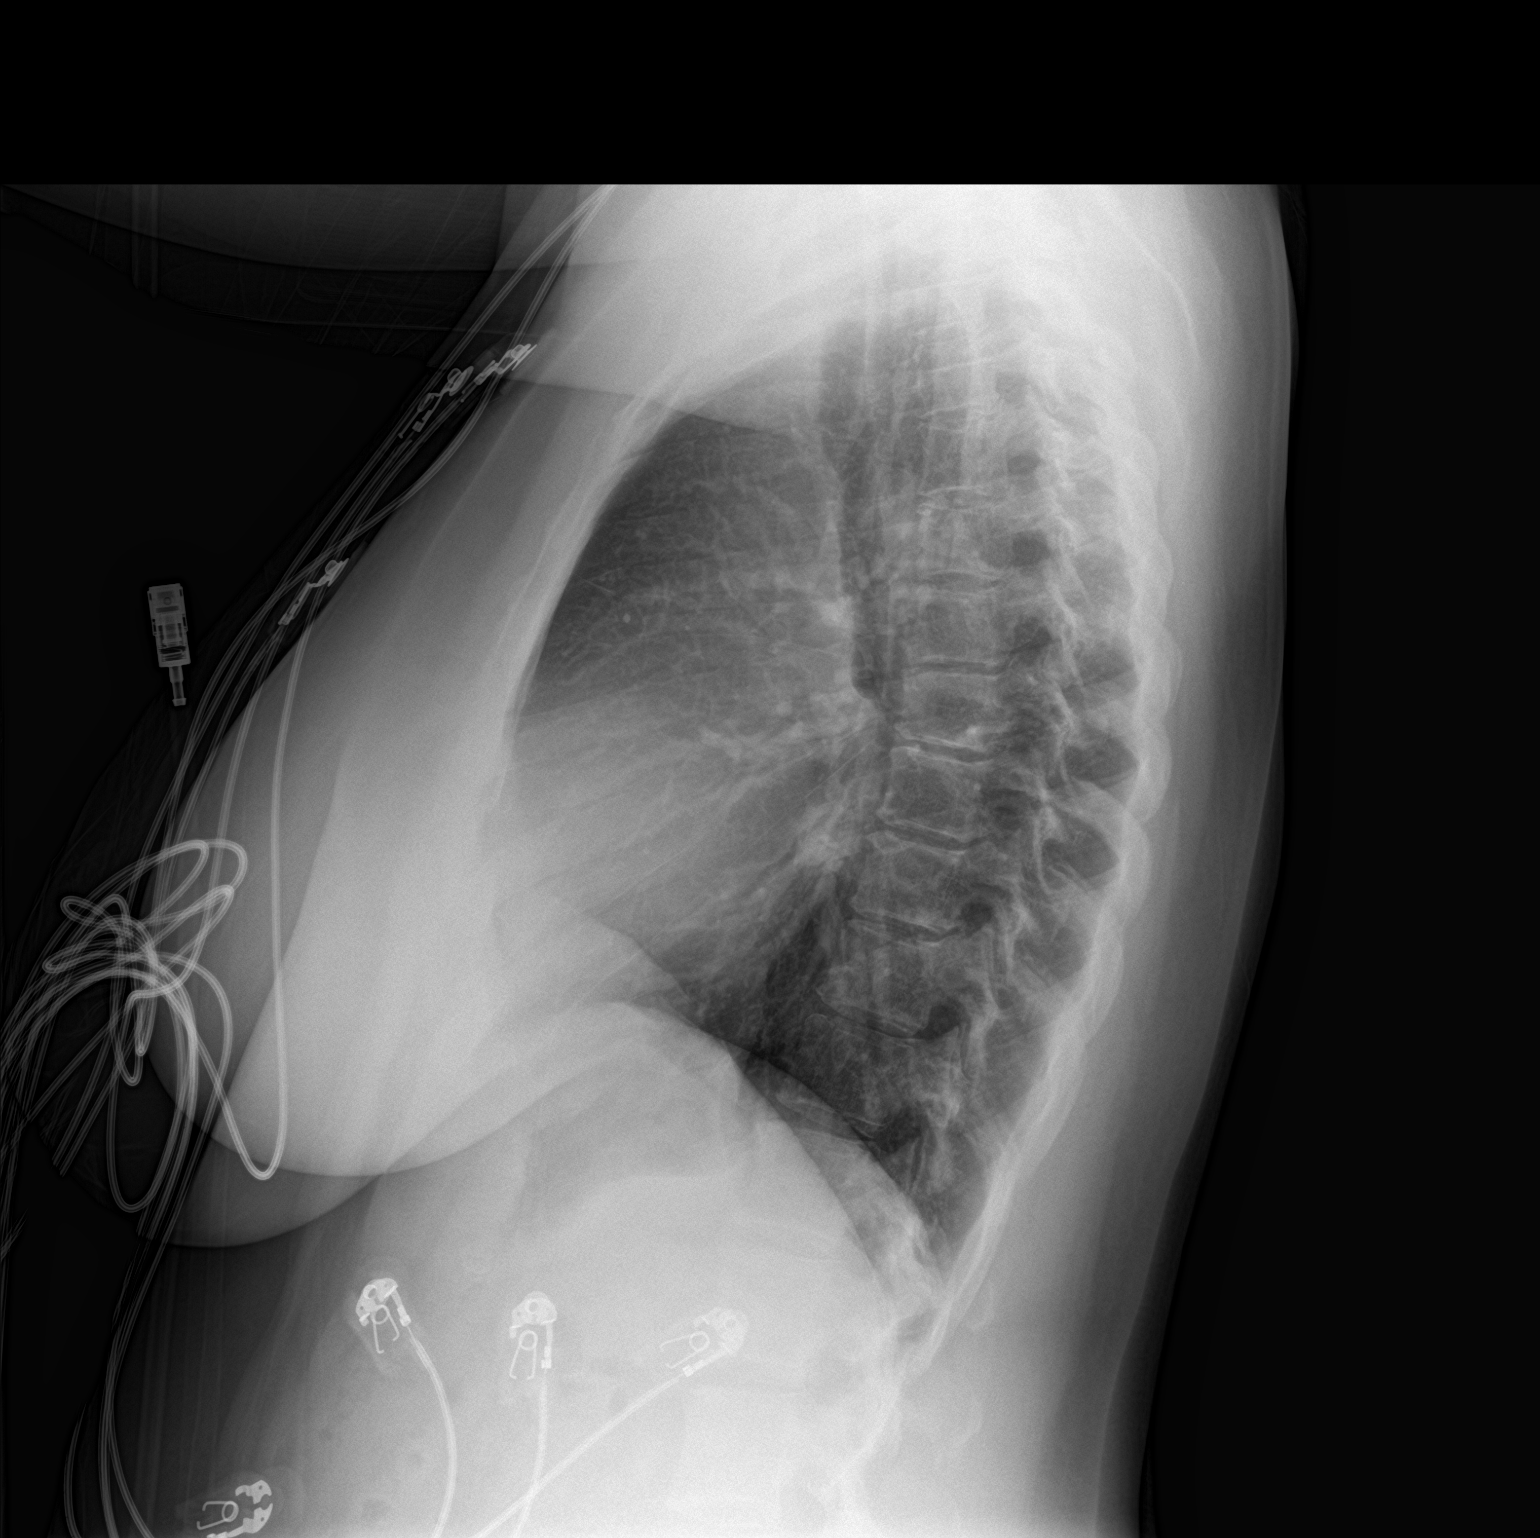

[2 of 2 positions shown; findings below may reference images not displayed]

FINDINGS: Mediastinum and hilar structures normal. Heart size stable. No focal
infiltrate. No pleural effusion or pneumothorax. Thoracolumbar spine
scoliosis.
IMPRESSION: No acute cardiopulmonary disease.

## 2022-07-09 ENCOUNTER — Ambulatory Visit (HOSPITAL_COMMUNITY)
Admission: EM | Admit: 2022-07-09 | Discharge: 2022-07-09 | Disposition: A | Discharge status: Home / Self Care | Payer: 59 | Attending: Emergency Medicine | Admitting: Emergency Medicine

## 2022-07-09 ENCOUNTER — Encounter (HOSPITAL_COMMUNITY): Payer: Self-pay

## 2022-07-09 DIAGNOSIS — N898 Other specified noninflammatory disorders of vagina: Secondary | ICD-10-CM

## 2022-07-09 DIAGNOSIS — Z113 Encounter for screening for infections with a predominantly sexual mode of transmission: Secondary | ICD-10-CM

## 2022-07-09 LAB — POCT URINALYSIS DIPSTICK, ED / UC
Glucose, UA: NEGATIVE mg/dL
Leukocytes,Ua: NEGATIVE
Nitrite: NEGATIVE
Protein, ur: 100 mg/dL — AB
Specific Gravity, Urine: >=1.03 (ref 1.005–1.030)
Urobilinogen, UA: 0.2 mg/dL (ref 0.0–1.0)
pH: 5.5 (ref 5.0–8.0)

## 2022-07-09 NOTE — ED Provider Notes (Signed)
[facility]    CSN: [id number] Arrival date & time: 07/09/22  1934      History   Chief Complaint Chief Complaint  Patient presents with   UTI   Exposure to STD    HPI [name] is a 57 y.o. female.  Here with vaginal odor Would like STD testing She does report unprotected intercourse Denies any discharge, bleeding, spotting, rash, irritation  No urinary symptoms.  She wanted to have her urine checked just in case that was the order.  Does not drink much water. No fever or flank pain  Past Medical History:  Diagnosis Date   Gastritis    GERD (gastroesophageal reflux disease)     There are no problems to display for this patient.   Past Surgical History:  Procedure Laterality Date   ECTOPIC PREGNANCY SURGERY     X 7 tubes were ruptured   ectopics     pt. has had 6 ectopic pregnancies    OB History   No obstetric history on file.      Home Medications    Prior to Admission medications   Not on File    Family History History reviewed. No pertinent family history.  Social History Social History   Tobacco Use   Smoking status: Every Day    Packs/day: 1    Types: Cigarettes   Smokeless tobacco: Never  Vaping Use   Vaping Use: Never used  Substance Use Topics   Alcohol use: Yes    Comment: weekends   Drug use: Yes    Types: Marijuana    Comment: marijuana use once a week     Allergies   Patient has no known allergies.   Review of Systems Review of Systems As per HPI  Physical Exam Triage Vital Signs ED Triage Vitals  Enc Vitals Group     BP 07/09/22 2008 (!) 150/89     Pulse Rate 07/09/22 2008 80     Resp 07/09/22 2008 20     Temp 07/09/22 2008 98 F (36.7 C)     Temp Source 07/09/22 2008 Oral     SpO2 07/09/22 2008 95 %     Weight 07/09/22 2006 170 lb (77.1 kg)     Height --      Head Circumference --      Peak Flow --      Pain Score 07/09/22 2003 0     Pain Loc --      Pain Edu? --      Excl. in [place]? --     No data found.  Updated Vital Signs BP (!) 150/89 (BP Location: Left Arm)   Pulse 80   Temp 98 F (36.7 C) (Oral)   Resp 20   Wt 170 lb (77.1 kg)   SpO2 95%   BMI 28.29 kg/m   Physical Exam Vitals and nursing note reviewed.  Constitutional:      General: She is not in acute distress. HENT:     Mouth/Throat:     Mouth: Mucous membranes are moist.     Pharynx: Oropharynx is clear.  Eyes:     Conjunctiva/sclera: Conjunctivae normal.     Pupils: Pupils are equal, round, and reactive to light.  Cardiovascular:     Rate and Rhythm: Normal rate and regular rhythm.     Heart sounds: Normal heart sounds.  Pulmonary:     Effort: Pulmonary effort is normal.     Breath sounds: Normal breath sounds.  Abdominal:     General: Bowel sounds are normal.     Palpations: Abdomen is soft.     Tenderness: There is no abdominal tenderness. There is no right CVA tenderness or left CVA tenderness.  Neurological:     Mental Status: She is alert and oriented to person, place, and time.      UC Treatments / Results  Labs (all labs ordered are listed, but only abnormal results are displayed) Labs Reviewed  POCT URINALYSIS DIPSTICK, ED / UC - Abnormal; Notable for the following components:      Result Value   Bilirubin Urine SMALL (*)    Ketones, ur TRACE (*)    Hgb urine dipstick LARGE (*)    Protein, ur 100 (*)    All other components within normal limits  URINE CULTURE  CERVICOVAGINAL ANCILLARY ONLY    EKG   Radiology No results found.  Procedures Procedures (including critical care time)  Medications Ordered in UC Medications - No data to display  Initial Impression / Assessment and Plan / UC Course  I have reviewed the triage vital signs and the nursing notes.  Pertinent labs & imaging results that were available during my care of the patient were reviewed by me and considered in my medical decision making (see chart for details).  UA trace hemoglobin.  Not likely  infection but will culture.  Elevated specific gravity.  Recommend to increase water intake.  Cytology swab is pending.  Will treat positive result if indicated.  Provided with OB/GYN clinic info and PCP to call for follow-up  Final Clinical Impressions(s) / UC Diagnoses   Final diagnoses:  Screen for STD (sexually transmitted disease)  Vaginal odor     Discharge Instructions      Urine looks good!  We will call you if anything on your vaginal swab returns positive. Please abstain from sexual intercourse until your results return.  You can call the ob/gyn clinic to set up appointment.   I also recommend Alyssa Allwardt, PA as a primary care provider. Give the office a call to set up appointment!     ED Prescriptions   None    PDMP not reviewed this encounter.   Kyra Leyland 07/09/22 2039

## 2022-07-09 NOTE — Discharge Instructions (Addendum)
Urine looks good!  We will call you if anything on your vaginal swab returns positive. Please abstain from sexual intercourse until your results return.  You can call the ob/gyn clinic to set up appointment.   I also recommend Alyssa Allwardt, PA as a primary care provider. Give the office a call to set up appointment!

## 2022-07-09 NOTE — ED Triage Notes (Signed)
Pt states that she has had UTI sx for a few month. She states that she has a smell coming from her vagina. She wants to be tested for STD's and wet prep. No itching. Little discharge. Does have unprotected sex. Hasn't taken anything

## 2022-07-10 LAB — CERVICOVAGINAL ANCILLARY ONLY
Bacterial Vaginitis (gardnerella): POSITIVE — AB
Candida Glabrata: NEGATIVE
Candida Vaginitis: NEGATIVE
Chlamydia: NEGATIVE
Comment: NEGATIVE
Comment: NEGATIVE
Comment: NEGATIVE
Comment: NEGATIVE
Comment: NEGATIVE
Comment: NORMAL
Neisseria Gonorrhea: NEGATIVE
Trichomonas: NEGATIVE

## 2022-07-12 LAB — URINE CULTURE: Culture: >=100000 — AB

## 2022-07-13 ENCOUNTER — Other Ambulatory Visit: Payer: Self-pay

## 2022-07-13 ENCOUNTER — Telehealth (HOSPITAL_COMMUNITY): Payer: Self-pay | Admitting: Emergency Medicine

## 2022-07-13 MED ORDER — METRONIDAZOLE 500 MG PO TABS
500.0000 mg | ORAL_TABLET | Freq: Two times a day (BID) | ORAL | 0 refills | Status: DC
  Filled 2022-07-13: qty 14, 7d supply, fill #0

## 2022-07-13 MED ORDER — NITROFURANTOIN MONOHYD MACRO 100 MG PO CAPS
100.0000 mg | ORAL_CAPSULE | Freq: Two times a day (BID) | ORAL | 0 refills | Status: DC
  Filled 2022-07-13: qty 10, 5d supply, fill #0

## 2022-07-17 ENCOUNTER — Other Ambulatory Visit: Payer: Self-pay

## 2023-02-03 ENCOUNTER — Ambulatory Visit (HOSPITAL_COMMUNITY)
Admission: EM | Admit: 2023-02-03 | Discharge: 2023-02-03 | Disposition: A | Discharge status: Home / Self Care | Payer: Medicaid Other

## 2023-02-03 ENCOUNTER — Encounter (HOSPITAL_COMMUNITY): Payer: Self-pay

## 2023-02-03 DIAGNOSIS — J069 Acute upper respiratory infection, unspecified: Secondary | ICD-10-CM

## 2023-02-03 MED ORDER — PREDNISONE 20 MG PO TABS: 40.0000 mg | ORAL_TABLET | Freq: Every day | ORAL | 0 refills | Status: AC

## 2023-02-03 MED ORDER — ALBUTEROL SULFATE HFA 108 (90 BASE) MCG/ACT IN AERS: 1.0000 | INHALATION_SPRAY | Freq: Four times a day (QID) | RESPIRATORY_TRACT | 0 refills | Status: AC | PRN

## 2023-02-03 MED ORDER — PROMETHAZINE-DM 6.25-15 MG/5ML PO SYRP: 5.0000 mL | ORAL_SOLUTION | Freq: Four times a day (QID) | ORAL | 0 refills | Status: AC | PRN

## 2023-02-03 NOTE — ED Triage Notes (Signed)
Patient here today with c/o cough, wheeze, and chest congestion X 1 week. She has been taking an OTC cough medicine with no relief. Her grandson is also sick.

## 2023-02-03 NOTE — ED Provider Notes (Signed)
[facility]    CSN: [id number] Arrival date & time: 02/03/23  1153      History   Chief Complaint Chief Complaint  Patient presents with   Cough    HPI Alison Sanchez is a 57 y.o. female.   Patient presents to clinic complaining of cough for the past week.  Reports she has a lot of phlegm and this will occasionally come up with coughing.  Cough is worse at night and is interfering with her sleep.  She has been taking an over-the-counter cough medication from [facility] with some relief and drinking peppermint tea with honey.  Reports some expiratory wheezing and feeling a rattle at night.  She was smoking 2 packs of cigarettes per day, but with her nicotine patch she is only smoking a pack every 2 to 3 days.  Denies any history / diagnosis of COPD, asthma or chronic respiratory illnesses.  Denies fevers, sore throat, abdominal pain, nausea, vomiting or diarrhea.   The history is provided by the patient and medical records.  Cough   Past Medical History:  Diagnosis Date   Gastritis    GERD (gastroesophageal reflux disease)     There are no problems to display for this patient.   Past Surgical History:  Procedure Laterality Date   ECTOPIC PREGNANCY SURGERY     X 7 tubes were ruptured   ectopics     pt. has had 6 ectopic pregnancies    OB History   No obstetric history on file.      Home Medications    Prior to Admission medications   Medication Sig Start Date End Date Taking? Authorizing Provider  albuterol (VENTOLIN HFA) 108 (90 Base) MCG/ACT inhaler Inhale 1-2 puffs into the lungs every 6 (six) hours as needed for wheezing or shortness of breath. 02/03/23  Yes Rinaldo Ratel, [place] N, FNP  metFORMIN (GLUCOPHAGE) 500 MG tablet Take 500 mg by mouth 2 (two) times daily. 11/12/22  Yes [provider]  nicotine (NICODERM CQ - DOSED IN MG/24 HOURS) 21 mg/24hr patch Place 21 mg onto the skin daily. 11/06/22  Yes [provider]  predniSONE  (DELTASONE) 20 MG tablet Take 2 tablets (40 mg total) by mouth daily for 5 days. 02/03/23 02/08/23 Yes Rinaldo Ratel, [place] N, FNP  promethazine-dextromethorphan (PROMETHAZINE-DM) 6.25-15 MG/5ML syrup Take 5 mLs by mouth 4 (four) times daily as needed for cough. 02/03/23  Yes Rinaldo Ratel, [place] N, FNP  rosuvastatin (CRESTOR) 5 MG tablet Take 5 mg by mouth daily. 11/12/22  Yes [provider]  Vitamin D, Ergocalciferol, (DRISDOL) 1.25 MG (50000 UNIT) CAPS capsule Take 50,000 Units by mouth once a week. 11/12/22  Yes [provider]    Family History History reviewed. No pertinent family history.  Social History Social History   Tobacco Use   Smoking status: Every Day    Current packs/day: 1.00    Types: Cigarettes   Smokeless tobacco: Never  Vaping Use   Vaping status: Never Used  Substance Use Topics   Alcohol use: Yes    Comment: weekends   Drug use: Yes    Types: Marijuana    Comment: marijuana use once a week     Allergies   Patient has no known allergies.   Review of Systems Review of Systems  Per HPI  Physical Exam Triage Vital Signs ED Triage Vitals [02/03/23 1233]  Encounter Vitals Group     BP 131/73     Systolic BP Percentile  Diastolic BP Percentile      Pulse Rate 74     Resp 16     Temp 97.9 F (36.6 C)     Temp Source Oral     SpO2 98 %     Weight 173 lb (78.5 kg)     Height      Head Circumference      Peak Flow      Pain Score 0     Pain Loc      Pain Education      Exclude from Growth Chart    No data found.  Updated Vital Signs BP 131/73 (BP Location: Left Arm)   Pulse 74   Temp 97.9 F (36.6 C) (Oral)   Resp 16   Wt 173 lb (78.5 kg)   LMP  (LMP Unknown)   SpO2 98%   BMI 28.79 kg/m   Visual Acuity Right Eye Distance:   Left Eye Distance:   Bilateral Distance:    Right Eye Near:   Left Eye Near:    Bilateral Near:     Physical Exam Vitals and nursing note reviewed.  Constitutional:      Appearance:  Normal appearance.  HENT:     Head: Normocephalic and atraumatic.     Right Ear: External ear normal.     Left Ear: External ear normal.     Nose: Nose normal.     Mouth/Throat:     Mouth: Mucous membranes are moist.     Pharynx: Posterior oropharyngeal erythema present.  Eyes:     Conjunctiva/sclera: Conjunctivae normal.  Cardiovascular:     Rate and Rhythm: Normal rate and regular rhythm.     Heart sounds: Normal heart sounds. No murmur heard. Pulmonary:     Effort: Pulmonary effort is normal.     Breath sounds: Wheezing present.  Musculoskeletal:        General: Normal range of motion.  Skin:    General: Skin is warm and dry.  Neurological:     General: No focal deficit present.     Mental Status: She is alert and oriented to person, place, and time.  Psychiatric:        Mood and Affect: Mood normal.        Behavior: Behavior normal.      UC Treatments / Results  Labs (all labs ordered are listed, but only abnormal results are displayed) Labs Reviewed - No data to display  EKG   Radiology No results found.  Procedures Procedures (including critical care time)  Medications Ordered in UC Medications - No data to display  Initial Impression / Assessment and Plan / UC Course  I have reviewed the triage vital signs and the nursing notes.  Pertinent labs & imaging results that were available during my care of the patient were reviewed by me and considered in my medical decision making (see chart for details).  Vitals and triage reviewed, patient is hemodynamically stable.  Expiratory wheezing in middle and lower lobes on physical exam, heart with regular rate and rhythm.  Suspect some chronic lung disease d/t smoking hx. Will cover with steroid burst, albuterol inhaler and cough medicine as needed.  Low suspicion for bacterial etiology at this time.  Plan of care, follow-up care return precautions given, no questions at this time.     Final Clinical  Impressions(s) / UC Diagnoses   Final diagnoses:  Viral URI with cough     Discharge Instructions  Use the albuterol inhaler as needed for any wheezing or shortness of breath.  Start the steroids in the morning daily with breakfast.  Use the cough medication as needed, do not drink or drive on this medication as it may cause drowsiness.  You can also sleep with a humidifier to help moisten your secretions.  Ensure you are staying well-hydrated and getting plenty of rest.  Please return to clinic if no improvement despite these interventions, worsening shortness of breath, fevers, or any new urgent symptoms.      ED Prescriptions     Medication Sig Dispense Auth. Provider   albuterol (VENTOLIN HFA) 108 (90 Base) MCG/ACT inhaler Inhale 1-2 puffs into the lungs every 6 (six) hours as needed for wheezing or shortness of breath. 18 g Rinaldo Ratel, [place] N, [place]   predniSONE (DELTASONE) 20 MG tablet Take 2 tablets (40 mg total) by mouth daily for 5 days. 10 tablet Rinaldo Ratel, [place] N, [place]   promethazine-dextromethorphan (PROMETHAZINE-DM) 6.25-15 MG/5ML syrup Take 5 mLs by mouth 4 (four) times daily as needed for cough. 118 mL Treyten Monestime, [place] N, [place]      PDMP not reviewed this encounter.   Kadience Macchi, [place] N, [place] 02/03/23 1302

## 2023-02-03 NOTE — Discharge Instructions (Addendum)
Use the albuterol inhaler as needed for any wheezing or shortness of breath.  Start the steroids in the morning daily with breakfast.  Use the cough medication as needed, do not drink or drive on this medication as it may cause drowsiness.  You can also sleep with a humidifier to help moisten your secretions.  Ensure you are staying well-hydrated and getting plenty of rest.  Please return to clinic if no improvement despite these interventions, worsening shortness of breath, fevers, or any new urgent symptoms.
